# Patient Record
Sex: Female | Born: 1995 | Race: White | Hispanic: No | Marital: Married | Smoking: Never smoker
Health system: Southern US, Community
[De-identification: ages and names within clinical notes are randomized; demographics above are authoritative.]

## PROBLEM LIST (undated history)

## (undated) ENCOUNTER — Inpatient Hospital Stay: Payer: Self-pay

## (undated) DIAGNOSIS — K219 Gastro-esophageal reflux disease without esophagitis: Secondary | ICD-10-CM

## (undated) DIAGNOSIS — J020 Streptococcal pharyngitis: Secondary | ICD-10-CM

## (undated) DIAGNOSIS — D649 Anemia, unspecified: Secondary | ICD-10-CM

## (undated) DIAGNOSIS — R079 Chest pain, unspecified: Secondary | ICD-10-CM

## (undated) DIAGNOSIS — E039 Hypothyroidism, unspecified: Secondary | ICD-10-CM

## (undated) HISTORY — PX: ADENOIDECTOMY: SUR15

---
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
## 2016-01-28 ENCOUNTER — Encounter: Payer: Self-pay | Admitting: Emergency Medicine

## 2016-01-28 ENCOUNTER — Ambulatory Visit
Admission: EM | Admit: 2016-01-28 | Discharge: 2016-01-28 | Disposition: A | Discharge status: Home / Self Care | Payer: BLUE CROSS/BLUE SHIELD | Attending: Emergency Medicine | Admitting: Emergency Medicine

## 2016-01-28 DIAGNOSIS — J069 Acute upper respiratory infection, unspecified: Secondary | ICD-10-CM | POA: Diagnosis not present

## 2016-01-28 LAB — RAPID STREP SCREEN (MED CTR MEBANE ONLY): Streptococcus, Group A Screen (Direct): NEGATIVE

## 2016-01-28 MED ORDER — AMOXICILLIN-POT CLAVULANATE 875-125 MG PO TABS: 1.0000 | ORAL_TABLET | Freq: Two times a day (BID) | ORAL | 0 refills | Status: DC

## 2016-01-28 MED ORDER — FLUTICASONE PROPIONATE 50 MCG/ACT NA SUSP: 2.0000 | Freq: Every day | NASAL | 0 refills | Status: DC

## 2016-01-28 NOTE — ED Provider Notes (Signed)
CSN: [id number]     Arrival date & time 01/28/16  1932 History   First MD Initiated Contact with Patient 01/28/16 1954     Chief Complaint  Patient presents with  . Sore Throat   (Consider location/radiation/quality/duration/timing/severity/associated sxs/prior Treatment) HPI  This 20 year old female presents with a two-week history of sore throat cough and stuffy nose. She recently started complaining of right ear pain started 2 days ago. She's had no fever or chills. Her coughing has been essentially nonproductive. He has had mucus drainage from her nose of yellow-green color.   Past Medical History:  Diagnosis Date  . Strep pharyngitis    Past Surgical History:  Procedure Laterality Date  . adnoids     Family History  Problem Relation Age of Onset  . Breast cancer Mother   . Hyperlipidemia Father   . Alzheimer's disease Paternal Grandfather    Social History  Substance Use Topics  . Smoking status: Never Smoker  . Smokeless tobacco: Never Used  . Alcohol use No   OB History    No data available     Review of Systems  Constitutional: Negative for activity change, appetite change, chills, fatigue and fever.  HENT: Positive for congestion, ear pain, postnasal drip, rhinorrhea, sinus pressure and sore throat.   Respiratory: Positive for cough.   All other systems reviewed and are negative.   Allergies  Review of patient's allergies indicates no known allergies.  Home Medications   Prior to Admission medications   Medication Sig Start Date End Date Taking? Authorizing Provider  amoxicillin-clavulanate (AUGMENTIN) 875-125 MG tablet Take 1 tablet by mouth every 12 (twelve) hours. 01/28/16   Lutricia Feil, PA-C  ascorbic acid (VITAMIN C) 1000 MG tablet Take by mouth. 07/13/10   Historical Provider, MD  Biotin 10 MG CAPS Take by mouth.    Historical Provider, MD  Cetirizine HCl (ZYRTEC ALLERGY) 10 MG CAPS Take by mouth. Reported on 07/01/2015    Historical Provider, MD   Cholecalciferol (VITAMIN D3) 400 units CAPS Take by mouth.    Historical Provider, MD  diphenhydrAMINE (BENADRYL) 25 mg capsule Take 25 mg by mouth every 6 (six) hours as needed. Reported on 07/01/2015    Historical Provider, MD  fluticasone (FLONASE) 50 MCG/ACT nasal spray Place 2 sprays into both nostrils daily. 01/28/16   Lutricia Feil, PA-C  GLUCOSAMINE SULFATE PO Take by mouth.    Historical Provider, MD  ibuprofen (ADVIL,MOTRIN) 800 MG tablet Take 1 tablet (800 mg total) by mouth 3 (three) times daily. 08/02/15   Domenick Gong, MD  Lysine HCl 1000 MG TABS Take 2,000 mg by mouth.     Historical Provider, MD  medroxyPROGESTERone (PROVERA) 10 MG tablet Take 1 tablet (10 mg total) by mouth daily. 07/01/15   Lorie Phenix, MD  Multiple Vitamin (MULTIVITAMIN) capsule Take 1 capsule by mouth daily.    Historical Provider, MD  pyridOXINE (VITAMIN B-6) 100 MG tablet Take 100 mg by mouth daily.    Historical Provider, MD  Vitamin E 100 units TABS Take by mouth.    Historical Provider, MD   Meds Ordered and Administered this Visit  Medications - No data to display  BP 97/63 (BP Location: Left Arm)   Pulse 78   Temp 97.7 F (36.5 C) (Tympanic)   Resp 16   Ht 5\' 1"  (1.549 m)   Wt 110 lb (49.9 kg)   LMP 01/14/2016 (Exact Date)   SpO2 100%   BMI 20.78 kg/m  No  data found.   Physical Exam  Constitutional: She is oriented to person, place, and time. She appears well-developed and well-nourished. No distress.  HENT:  Head: Normocephalic and atraumatic.  Nose: Nose normal.  Mouth/Throat: Oropharynx is clear and moist.  TM is normal right TM is injected and dark. She has tenderness over the maxillary sinuses more on the right.  Eyes: EOM are normal. Pupils are equal, round, and reactive to light. Right eye exhibits no discharge. Left eye exhibits no discharge.  Neck: Normal range of motion. Neck supple.  Pulmonary/Chest: Effort normal and breath sounds normal. No respiratory distress. She  has no wheezes. She has no rales.  Musculoskeletal: Normal range of motion.  Lymphadenopathy:    She has no cervical adenopathy.  Neurological: She is alert and oriented to person, place, and time.  Skin: Skin is warm and dry. She is not diaphoretic.  Psychiatric: She has a normal mood and affect. Her behavior is normal. Judgment and thought content normal.  Nursing note and vitals reviewed.   Urgent Care Course   Clinical Course    Procedures (including critical care time)  Labs Review Labs Reviewed  RAPID STREP SCREEN (NOT AT [facility])  CULTURE, GROUP A STREP [facility]    Imaging Review No results found.   Visual Acuity Review  Right Eye Distance:   Left Eye Distance:   Bilateral Distance:    Right Eye Near:   Left Eye Near:    Bilateral Near:         MDM   1. URI (upper respiratory infection)    Discharge Medication List as of 01/28/2016  8:09 PM    START taking these medications   Details  amoxicillin-clavulanate (AUGMENTIN) 875-125 MG tablet Take 1 tablet by mouth every 12 (twelve) hours., Starting Fri 01/28/2016, Print    fluticasone (FLONASE) 50 MCG/ACT nasal spray Place 2 sprays into both nostrils daily., Starting Fri 01/28/2016, Print      Plan: 1. Test/x-ray results and diagnosis reviewed with patient 2. rx as per orders; risks, benefits, potential side effects reviewed with patient 3. Recommend supportive treatment with Flonase. I will start her on Augmentin since she has had this for 2 weeks and has a greenish yellow discharge from her nose along with the tenderness to percussion on the left maxillary sinus. If she is not improving or recommend she follow-up with her primary care physician. 4. F/u prn if symptoms worsen or don't improve     Lutricia FeilWilliam P Takasha Vetere, PA-C 01/28/16 2039

## 2016-01-28 NOTE — ED Triage Notes (Signed)
Patient c/o sore throat, cough, stuffy nose for 2 weeks.  Patient c/o right ear pain that started 2 days ago. Patient denies fevers.

## 2016-01-31 LAB — CULTURE, GROUP A STREP (THRC)

## 2016-03-12 ENCOUNTER — Ambulatory Visit
Admission: EM | Admit: 2016-03-12 | Discharge: 2016-03-12 | Disposition: A | Discharge status: Home / Self Care | Payer: BLUE CROSS/BLUE SHIELD | Attending: Family Medicine | Admitting: Family Medicine

## 2016-03-12 DIAGNOSIS — H6981 Other specified disorders of Eustachian tube, right ear: Secondary | ICD-10-CM | POA: Diagnosis not present

## 2016-03-12 DIAGNOSIS — H9201 Otalgia, right ear: Secondary | ICD-10-CM

## 2016-03-12 MED ORDER — FLUTICASONE PROPIONATE 50 MCG/ACT NA SUSP
2.0000 | Freq: Every day | NASAL | 2 refills | Status: DC
Start: 2016-03-12 — End: 2016-05-18

## 2016-03-12 MED ORDER — AMOXICILLIN-POT CLAVULANATE 875-125 MG PO TABS: 1.0000 | ORAL_TABLET | Freq: Two times a day (BID) | ORAL | 0 refills | Status: DC

## 2016-03-12 MED ORDER — FEXOFENADINE-PSEUDOEPHED ER 180-240 MG PO TB24: 1.0000 | ORAL_TABLET | Freq: Every day | ORAL | 0 refills | Status: DC

## 2016-03-12 MED ORDER — PREDNISONE 10 MG (21) PO TBPK: ORAL_TABLET | ORAL | 0 refills | Status: DC

## 2016-03-12 NOTE — ED Triage Notes (Signed)
Patient complains of right ear pain with clogging. Patient states that she was seen for this last month and she has tried OTC ear drops without relief.

## 2016-03-12 NOTE — ED Provider Notes (Signed)
[facility]    CSN: [id number] Arrival date & time: 03/12/16  1536     History   Chief Complaint Chief Complaint  Patient presents with  . Otalgia    HPI Kelly Ford is a 20 y.o. female.   Patient is here because of right ear pain. She was seen by PA Mr. Phillis Knack of a few weeks ago. She states she was placed on Flonase but has not helped the right ear pain. She states that the right ear did not get better all and she still has pressure and buildup of discomfort over the right eardrum. She does not smoke. Does history of breast cancer mother and father with hyperlipidemia. In reviewing the notes Mr. Phillis Knack indicated he was calling in or saying a prescription for Augmentin and Flonase.   The history is provided by the patient. No language interpreter was used.  Otalgia  Location:  Right Quality:  Aching and pressure Severity:  Moderate Onset quality:  Unable to specify Timing:  Constant Progression:  Worsening Chronicity:  New Context: recent URI   Context: not direct blow, not elevation change, not foreign body in ear and not loud noise   Relieved by:  Nothing Worsened by:  Nothing Associated symptoms: congestion     Past Medical History:  Diagnosis Date  . Strep pharyngitis     Patient Active Problem List   Diagnosis Date Noted  . Anovulatory cycle 07/01/2015  . Constipation 07/01/2015  . Cystitis 06/22/2015  . Allergic rhinitis 06/21/2015  . Acid reflux 06/21/2015    Past Surgical History:  Procedure Laterality Date  . adnoids      OB History    No data available       Home Medications    Prior to Admission medications   Medication Sig Start Date End Date Taking? Authorizing Provider  ascorbic acid (VITAMIN C) 1000 MG tablet Take by mouth. 07/13/10  Yes Historical Provider, MD  Biotin 10 MG CAPS Take by mouth.   Yes Historical Provider, MD  Cholecalciferol (VITAMIN D3) 400 units CAPS Take by mouth.   Yes Historical Provider,  MD  diphenhydrAMINE (BENADRYL) 25 mg capsule Take 25 mg by mouth every 6 (six) hours as needed. Reported on 07/01/2015   Yes Historical Provider, MD  fluticasone (FLONASE) 50 MCG/ACT nasal spray Place 2 sprays into both nostrils daily. 01/28/16  Yes William P Roemer, PA-C  GLUCOSAMINE SULFATE PO Take by mouth.   Yes Historical Provider, MD  ibuprofen (ADVIL,MOTRIN) 800 MG tablet Take 1 tablet (800 mg total) by mouth 3 (three) times daily. 08/02/15  Yes Domenick Gong, MD  Lysine HCl 1000 MG TABS Take 2,000 mg by mouth.    Yes Historical Provider, MD  Multiple Vitamin (MULTIVITAMIN) capsule Take 1 capsule by mouth daily.   Yes Historical Provider, MD  pyridOXINE (VITAMIN B-6) 100 MG tablet Take 100 mg by mouth daily.   Yes Historical Provider, MD  Vitamin E 100 units TABS Take by mouth.   Yes Historical Provider, MD  amoxicillin-clavulanate (AUGMENTIN) 875-125 MG tablet Take 1 tablet by mouth every 12 (twelve) hours. 01/28/16   Lutricia Feil, PA-C  amoxicillin-clavulanate (AUGMENTIN) 875-125 MG tablet Take 1 tablet by mouth 2 (two) times daily. 03/12/16   Hassan Rowan, MD  Cetirizine HCl (ZYRTEC ALLERGY) 10 MG CAPS Take by mouth. Reported on 07/01/2015    Historical Provider, MD  fexofenadine-pseudoephedrine (ALLEGRA-D ALLERGY & CONGESTION) 180-240 MG 24 hr tablet Take 1 tablet by mouth daily. 03/12/16  Hassan RowanEugene Syana Degraffenreid, MD  fluticasone [facility](FLONASE) 50 MCG/ACT nasal spray Place 2 sprays into both nostrils daily. 03/12/16   Hassan RowanEugene Jakori Burkett, MD  predniSONE (STERAPRED UNI-PAK 21 TAB) 10 MG (21) TBPK tablet 6 tabs day 1 and 2, 5 tabs day 3 and 4, 4 tabs day 5 and 6, 3 tabs day 7 and 8, 2 tabs day 9 and 10, 1 tab day 11 and 12. Take orally 03/12/16   Hassan RowanEugene Rufus Beske, MD    Family History Family History  Problem Relation Age of Onset  . Breast cancer Mother   . Hyperlipidemia Father   . Alzheimer's disease Paternal Grandfather     Social History Social History  Substance Use Topics  . Smoking status: Never  Smoker  . Smokeless tobacco: Never Used  . Alcohol use No     Allergies   Review of patient's allergies indicates no known allergies.   Review of Systems Review of Systems  HENT: Positive for congestion and ear pain.      Physical Exam Triage Vital Signs ED Triage Vitals  Enc Vitals Group     BP 03/12/16 1550 93/63     Pulse Rate 03/12/16 1550 86     Resp 03/12/16 1550 16     Temp 03/12/16 1550 98.5 F (36.9 C)     Temp src --      SpO2 03/12/16 1550 98 %     Weight 03/12/16 1548 110 lb (49.9 kg)     Height 03/12/16 1548 5\' 1"  (1.549 m)     Head Circumference --      Peak Flow --      Pain Score 03/12/16 1552 5     Pain Loc --      Pain Edu? --      Excl. in GC? --    No data found.   Updated Vital Signs BP 93/63 (BP Location: Left Arm)   Pulse 86   Temp 98.5 F (36.9 C)   Resp 16   Ht 5\' 1"  (1.549 m)   Wt 110 lb (49.9 kg)   LMP 02/17/2016   SpO2 98%   BMI 20.78 kg/m   Visual Acuity Right Eye Distance:   Left Eye Distance:   Bilateral Distance:    Right Eye Near:   Left Eye Near:    Bilateral Near:     Physical Exam  HENT:  Head: Normocephalic.  Right Ear: Hearing, external ear and ear canal normal. Tympanic membrane is bulging.  Left Ear: Hearing, tympanic membrane, external ear and ear canal normal.  Nose: No mucosal edema, rhinorrhea or sinus tenderness. Right sinus exhibits no maxillary sinus tenderness and no frontal sinus tenderness. Left sinus exhibits no maxillary sinus tenderness and no frontal sinus tenderness.  Eyes: Pupils are equal, round, and reactive to light.  Neck: Normal range of motion. Neck supple.  Pulmonary/Chest: Effort normal.  Musculoskeletal: Normal range of motion.  Lymphadenopathy:    She has no cervical adenopathy.  Neurological: She is alert.  Skin: Skin is warm.  Psychiatric: She has a normal mood and affect.  Vitals reviewed.    UC Treatments / Results  Labs (all labs ordered are listed, but only  abnormal results are displayed) Labs Reviewed - No data to display  EKG  EKG Interpretation None       Radiology No results found.  Procedures Procedures (including critical care time)  Medications Ordered in UC Medications - No data to display   Initial Impression / Assessment and  Plan / UC Course  I have reviewed the triage vital signs and the nursing notes.  Pertinent labs & imaging results that were available during my care of the patient were reviewed by me and considered in my medical decision making (see chart for details).  Clinical Course    Patient insists that she never got a prescription for Augmentin. I will go ahead and place on a prescription for Augmentin repeat the Flonase dates spray [email or web address].  Final Clinical Impressions(s) / UC Diagnoses   Final diagnoses:  Otalgia of right ear  Eustachian tube dysfunction, right    New Prescriptions New Prescriptions   AMOXICILLIN-CLAVULANATE (AUGMENTIN) 875-125 MG TABLET    Take 1 tablet by mouth 2 (two) times daily.   FEXOFENADINE-PSEUDOEPHEDRINE (ALLEGRA-D ALLERGY & CONGESTION) 180-240 MG 24 HR TABLET    Take 1 tablet by mouth daily.   FLUTICASONE (FLONASE) 50 MCG/ACT NASAL SPRAY    Place 2 sprays into both nostrils daily.   PREDNISONE (STERAPRED UNI-PAK 21 TAB) 10 MG (21) TBPK TABLET    6 tabs day 1 and 2, 5 tabs day 3 and 4, 4 tabs day 5 and 6, 3 tabs day 7 and 8, 2 tabs day 9 and 10, 1 tab day 11 and 12. Take orally     Hassan RowanEugene Aquila Menzie, MD 03/12/16 [phone]

## 2016-03-15 ENCOUNTER — Telehealth: Payer: Self-pay

## 2016-03-15 NOTE — Telephone Encounter (Signed)
Courtesy call back completed today for patient's recent visit at [facility]. Patient did not answer, left message on machine to call back with any questions or concerns.   

## 2016-05-08 ENCOUNTER — Emergency Department
Admission: EM | Admit: 2016-05-08 | Discharge: 2016-05-08 | Disposition: A | Discharge status: Home / Self Care | Payer: BLUE CROSS/BLUE SHIELD | Attending: Emergency Medicine | Admitting: Emergency Medicine

## 2016-05-08 ENCOUNTER — Encounter: Payer: Self-pay | Admitting: *Deleted

## 2016-05-08 ENCOUNTER — Emergency Department: Payer: BLUE CROSS/BLUE SHIELD

## 2016-05-08 DIAGNOSIS — R0602 Shortness of breath: Secondary | ICD-10-CM | POA: Diagnosis present

## 2016-05-08 DIAGNOSIS — R0789 Other chest pain: Secondary | ICD-10-CM | POA: Insufficient documentation

## 2016-05-08 LAB — CBC WITH DIFFERENTIAL/PLATELET
Basophils Absolute: 0 10*3/uL (ref 0–0.1)
Basophils Relative: 1 %
Eosinophils Absolute: 0.1 10*3/uL (ref 0–0.7)
Eosinophils Relative: 1 %
HCT: 41.1 % (ref 35.0–47.0)
Hemoglobin: 13.8 g/dL (ref 12.0–16.0)
Lymphocytes Relative: 33 %
Lymphs Abs: 2.1 10*3/uL (ref 1.0–3.6)
MCH: 28.9 pg (ref 26.0–34.0)
MCHC: 33.7 g/dL (ref 32.0–36.0)
MCV: 85.8 fL (ref 80.0–100.0)
Monocytes Absolute: 0.4 10*3/uL (ref 0.2–0.9)
Monocytes Relative: 7 %
Neutro Abs: 3.6 10*3/uL (ref 1.4–6.5)
Neutrophils Relative %: 58 %
Platelets: 219 10*3/uL (ref 150–440)
RBC: 4.79 MIL/uL (ref 3.80–5.20)
RDW: 13.6 % (ref 11.5–14.5)
WBC: 6.2 10*3/uL (ref 3.6–11.0)

## 2016-05-08 LAB — BASIC METABOLIC PANEL
Anion gap: 6 (ref 5–15)
BUN: 14 mg/dL (ref 6–20)
CO2: 27 mmol/L (ref 22–32)
Calcium: 9.1 mg/dL (ref 8.9–10.3)
Chloride: 105 mmol/L (ref 101–111)
Creatinine, Ser: 0.78 mg/dL (ref 0.44–1.00)
GFR calc Af Amer: >60 mL/min (ref >60)
GFR calc non Af Amer: >60 mL/min (ref >60)
Glucose, Bld: 106 mg/dL — ABNORMAL HIGH (ref 65–99)
Potassium: 3.4 mmol/L — ABNORMAL LOW (ref 3.5–5.1)
Sodium: 138 mmol/L (ref 135–145)

## 2016-05-08 LAB — POCT PREGNANCY, URINE: Preg Test, Ur: NEGATIVE

## 2016-05-08 LAB — TROPONIN I: Troponin I: <0.03 ng/mL (ref <0.03)

## 2016-05-08 MED ORDER — GI COCKTAIL ~~LOC~~
30.0000 mL | Freq: Once | ORAL | Status: AC
  Administered 2016-05-08: 30 mL via ORAL
  Filled 2016-05-08: qty 30

## 2016-05-08 NOTE — ED Provider Notes (Signed)
[facility] Emergency Department Provider Note   ____________________________________________   I have reviewed the triage vital signs and the nursing notes.   HISTORY  Chief Complaint Chest Pain and Shortness of Breath   History limited by: Not Limited   HPI Kelly Ford is a 20 y.o. female who presents to the emergency department today because of concerns for chest pain. She describes it as sharp. It is located in the center chest. Started just shortly before presentation while the patient was eating dinner. The patient states that this was coming by some shortness of breath. She also felt like her right arm was shaking. Patient has never had symptoms this bad before but does state that she has been getting some chest pain roughly once a month. Patient denies being under any recent stress. No recent travel. She is not on any estrogen. No history of blood counts. No surgeries. Family history with grandfather with dysrhythmias.   Past Medical History:  Diagnosis Date  . Strep pharyngitis     Patient Active Problem List   Diagnosis Date Noted  . Anovulatory cycle 07/01/2015  . Constipation 07/01/2015  . Cystitis 06/22/2015  . Allergic rhinitis 06/21/2015  . Acid reflux 06/21/2015    Past Surgical History:  Procedure Laterality Date  . adnoids      Prior to Admission medications   Medication Sig Start Date End Date Taking? Authorizing Provider  amoxicillin-clavulanate (AUGMENTIN) 875-125 MG tablet Take 1 tablet by mouth every 12 (twelve) hours. 01/28/16   Lutricia FeilWilliam P Roemer, PA-C  amoxicillin-clavulanate (AUGMENTIN) 875-125 MG tablet Take 1 tablet by mouth 2 (two) times daily. 03/12/16   Hassan RowanEugene Wade, MD  ascorbic acid (VITAMIN C) 1000 MG tablet Take by mouth. 07/13/10   Historical Provider, MD  Biotin 10 MG CAPS Take by mouth.    Historical Provider, MD  Cetirizine HCl (ZYRTEC ALLERGY) 10 MG CAPS Take by mouth. Reported on 07/01/2015     Historical Provider, MD  Cholecalciferol (VITAMIN D3) 400 units CAPS Take by mouth.    Historical Provider, MD  diphenhydrAMINE (BENADRYL) 25 mg capsule Take 25 mg by mouth every 6 (six) hours as needed. Reported on 07/01/2015    Historical Provider, MD  fexofenadine-pseudoephedrine (ALLEGRA-D ALLERGY & CONGESTION) 180-240 MG 24 hr tablet Take 1 tablet by mouth daily. 03/12/16   Hassan RowanEugene Wade, MD  fluticasone (FLONASE) 50 MCG/ACT nasal spray Place 2 sprays into both nostrils daily. 01/28/16   Lutricia FeilWilliam P Roemer, PA-C  fluticasone (FLONASE) 50 MCG/ACT nasal spray Place 2 sprays into both nostrils daily. 03/12/16   Hassan RowanEugene Wade, MD  GLUCOSAMINE SULFATE PO Take by mouth.    Historical Provider, MD  ibuprofen (ADVIL,MOTRIN) 800 MG tablet Take 1 tablet (800 mg total) by mouth 3 (three) times daily. 08/02/15   Domenick GongAshley Mortenson, MD  Lysine HCl 1000 MG TABS Take 2,000 mg by mouth.     Historical Provider, MD  Multiple Vitamin (MULTIVITAMIN) capsule Take 1 capsule by mouth daily.    Historical Provider, MD  predniSONE (STERAPRED UNI-PAK 21 TAB) 10 MG (21) TBPK tablet 6 tabs day 1 and 2, 5 tabs day 3 and 4, 4 tabs day 5 and 6, 3 tabs day 7 and 8, 2 tabs day 9 and 10, 1 tab day 11 and 12. Take orally 03/12/16   Hassan RowanEugene Wade, MD  pyridOXINE (VITAMIN B-6) 100 MG tablet Take 100 mg by mouth daily.    Historical Provider, MD  Vitamin E 100 units TABS Take by mouth.  Historical Provider, MD    Allergies Patient has no known allergies.  Family History  Problem Relation Age of Onset  . Breast cancer Mother   . Hyperlipidemia Father   . Alzheimer's disease Paternal Grandfather     Social History Social History  Substance Use Topics  . Smoking status: Never Smoker  . Smokeless tobacco: Never Used  . Alcohol use No    Review of Systems  Constitutional: Negative for fever. Cardiovascular: Positive for chest pain. Respiratory: Positive for shortness of breath. Gastrointestinal: Negative for abdominal pain,  vomiting and diarrhea. Genitourinary: Negative for dysuria. Musculoskeletal: Negative for back pain. Skin: Negative for rash. Neurological: Negative for headaches, focal weakness or numbness.  10-point ROS otherwise negative.  ____________________________________________   PHYSICAL EXAM:  VITAL SIGNS: ED Triage Vitals  Enc Vitals Group     BP 05/08/16 1717 120/83     Pulse Rate 05/08/16 1717 76     Resp 05/08/16 1717 (!) 22     Temp 05/08/16 1717 98.6 F (37 C)     Temp Source 05/08/16 1717 Oral     SpO2 05/08/16 1717 99 %     Weight 05/08/16 1714 107 lb (48.5 kg)     Height 05/08/16 1714 5\' 1"  (1.549 m)     Head Circumference --      Peak Flow --      Pain Score 05/08/16 1716 7     Pain Loc --      Pain Edu? --      Excl. in GC? --     Constitutional: Alert and oriented. Well appearing and in no distress. Eyes: Conjunctivae are normal. Normal extraocular movements. ENT   Head: Normocephalic and atraumatic.   Nose: No congestion/rhinnorhea.   Mouth/Throat: Mucous membranes are moist.   Neck: No stridor. Hematological/Lymphatic/Immunilogical: No cervical lymphadenopathy. Cardiovascular: Normal rate, regular rhythm.  No murmurs, rubs, or gallops.  Respiratory: Normal respiratory effort without tachypnea nor retractions. Breath sounds are clear and equal bilaterally. No wheezes/rales/rhonchi. Gastrointestinal: Soft and non tender. No rebound. No guarding.  Genitourinary: Deferred Musculoskeletal: Normal range of motion in all extremities. No lower extremity edema. Neurologic:  Normal speech and language. No gross focal neurologic deficits are appreciated.  Skin:  Skin is warm, dry and intact. No rash noted. Psychiatric: Mood and affect are normal. Speech and behavior are normal. Patient exhibits appropriate insight and judgment.  ____________________________________________    LABS (pertinent positives/negatives)  Labs Reviewed  BASIC METABOLIC PANEL -  Abnormal; Notable for the following:       Result Value   Potassium 3.4 (*)    Glucose, Bld 106 (*)    All other components within normal limits  CBC WITH DIFFERENTIAL/PLATELET  TROPONIN I  POCT PREGNANCY, URINE     ____________________________________________   EKG  I, Phineas SemenGraydon Jeydi Klingel, attending physician, personally viewed and interpreted this EKG  EKG Time: 1719 Rate: 73 Rhythm: normal sinus rhythm Axis: normal Intervals: qtc 442 QRS: non specific intrventricular conduction delay ST changes: no st elevation Impression: abnormal ekg   ____________________________________________    RADIOLOGY  CXR IMPRESSION:  Normal chest radiograph.      ___________________________________________   PROCEDURES  Procedures  ____________________________________________   INITIAL IMPRESSION / ASSESSMENT AND PLAN / ED COURSE  Pertinent labs & imaging results that were available during my care of the patient were reviewed by me and considered in my medical decision making (see chart for details).  Patient presented to the emergency department today because of concerns  for chest pain and shortness breath that occurred this evening. Exam patient no acute distress. GU with no ST elevation. Blood work was obtained which did not show any concerning findings. I did send a troponin however this was negative. At this point I doubt ACS. Patient has an atypical story. No risk factors. Additionally patient perked negative. No pneumothorax pneumonia identified on chest x-ray. Patient stated she felt better during the course of her stay here in the emergency department. Will discharge home to follow up with primary care physician.  ____________________________________________   FINAL CLINICAL IMPRESSION(S) / ED DIAGNOSES  Final diagnoses:  Atypical chest pain     Note: This dictation was prepared with Dragon dictation. Any transcriptional errors that result from this process are  unintentional     Phineas Semen, MD 05/08/16 1945

## 2016-05-08 NOTE — Discharge Instructions (Signed)
Please seek medical attention for any high fevers, chest pain, shortness of breath, change in behavior, persistent vomiting, bloody stool or any other new or concerning symptoms.  

## 2016-05-08 NOTE — ED Triage Notes (Signed)
States she was eating dinner and suddenly had a sharp pain in the middle of her chest, states SOB began, states feeling lightheaded in the car, pt shaking during triage

## 2016-05-18 ENCOUNTER — Ambulatory Visit (INDEPENDENT_AMBULATORY_CARE_PROVIDER_SITE_OTHER): Payer: BLUE CROSS/BLUE SHIELD

## 2016-05-18 ENCOUNTER — Ambulatory Visit
Admission: EM | Admit: 2016-05-18 | Discharge: 2016-05-18 | Disposition: A | Discharge status: Home / Self Care | Payer: BLUE CROSS/BLUE SHIELD | Attending: Family Medicine | Admitting: Family Medicine

## 2016-05-18 DIAGNOSIS — R112 Nausea with vomiting, unspecified: Secondary | ICD-10-CM | POA: Diagnosis not present

## 2016-05-18 DIAGNOSIS — K5909 Other constipation: Secondary | ICD-10-CM | POA: Diagnosis not present

## 2016-05-18 DIAGNOSIS — N3001 Acute cystitis with hematuria: Secondary | ICD-10-CM

## 2016-05-18 DIAGNOSIS — R1012 Left upper quadrant pain: Secondary | ICD-10-CM | POA: Diagnosis not present

## 2016-05-18 DIAGNOSIS — Z79899 Other long term (current) drug therapy: Secondary | ICD-10-CM | POA: Insufficient documentation

## 2016-05-18 DIAGNOSIS — R1010 Upper abdominal pain, unspecified: Secondary | ICD-10-CM | POA: Diagnosis not present

## 2016-05-18 DIAGNOSIS — K5900 Constipation, unspecified: Secondary | ICD-10-CM | POA: Diagnosis not present

## 2016-05-18 DIAGNOSIS — R079 Chest pain, unspecified: Secondary | ICD-10-CM | POA: Diagnosis not present

## 2016-05-18 DIAGNOSIS — R1110 Vomiting, unspecified: Secondary | ICD-10-CM | POA: Diagnosis present

## 2016-05-18 LAB — URINALYSIS, COMPLETE (UACMP) WITH MICROSCOPIC
Bilirubin Urine: NEGATIVE
Glucose, UA: NEGATIVE mg/dL
Ketones, ur: NEGATIVE mg/dL
Nitrite: NEGATIVE
Protein, ur: NEGATIVE mg/dL
Specific Gravity, Urine: 1.015 (ref 1.005–1.030)
pH: 7 (ref 5.0–8.0)

## 2016-05-18 LAB — RAPID STREP SCREEN (MED CTR MEBANE ONLY): Streptococcus, Group A Screen (Direct): NEGATIVE

## 2016-05-18 LAB — CBC WITH DIFFERENTIAL/PLATELET
Basophils Absolute: 0 10*3/uL (ref 0–0.1)
Basophils Relative: 1 %
Eosinophils Absolute: 0.1 10*3/uL (ref 0–0.7)
Eosinophils Relative: 1 %
HCT: 44.1 % (ref 35.0–47.0)
Hemoglobin: 14.6 g/dL (ref 12.0–16.0)
Lymphocytes Relative: 33 %
Lymphs Abs: 2.3 10*3/uL (ref 1.0–3.6)
MCH: 28.4 pg (ref 26.0–34.0)
MCHC: 33.1 g/dL (ref 32.0–36.0)
MCV: 85.9 fL (ref 80.0–100.0)
Monocytes Absolute: 0.5 10*3/uL (ref 0.2–0.9)
Monocytes Relative: 8 %
Neutro Abs: 4 10*3/uL (ref 1.4–6.5)
Neutrophils Relative %: 57 %
Platelets: 228 10*3/uL (ref 150–440)
RBC: 5.14 MIL/uL (ref 3.80–5.20)
RDW: 13.3 % (ref 11.5–14.5)
WBC: 7 10*3/uL (ref 3.6–11.0)

## 2016-05-18 LAB — COMPREHENSIVE METABOLIC PANEL
ALT: 33 U/L (ref 14–54)
AST: 35 U/L (ref 15–41)
Albumin: 4.8 g/dL (ref 3.5–5.0)
Alkaline Phosphatase: 50 U/L (ref 38–126)
Anion gap: 9 (ref 5–15)
BUN: 7 mg/dL (ref 6–20)
CO2: 23 mmol/L (ref 22–32)
Calcium: 9.4 mg/dL (ref 8.9–10.3)
Chloride: 102 mmol/L (ref 101–111)
Creatinine, Ser: 0.78 mg/dL (ref 0.44–1.00)
GFR calc Af Amer: >60 mL/min (ref >60)
GFR calc non Af Amer: >60 mL/min (ref >60)
Glucose, Bld: 91 mg/dL (ref 65–99)
Potassium: 3.7 mmol/L (ref 3.5–5.1)
Sodium: 134 mmol/L — ABNORMAL LOW (ref 135–145)
Total Bilirubin: 1.1 mg/dL (ref 0.3–1.2)
Total Protein: 8.3 g/dL — ABNORMAL HIGH (ref 6.5–8.1)

## 2016-05-18 LAB — PREGNANCY, URINE: Preg Test, Ur: NEGATIVE

## 2016-05-18 LAB — LIPASE, BLOOD: Lipase: 39 U/L (ref 11–51)

## 2016-05-18 LAB — RAPID INFLUENZA A&B ANTIGENS
Influenza A (ARMC): NEGATIVE
Influenza B (ARMC): NEGATIVE

## 2016-05-18 LAB — MONONUCLEOSIS SCREEN: Mono Screen: NEGATIVE

## 2016-05-18 LAB — AMYLASE: Amylase: 108 U/L — ABNORMAL HIGH (ref 28–100)

## 2016-05-18 MED ORDER — ONDANSETRON 8 MG PO TBDP
8.0000 mg | ORAL_TABLET | Freq: Once | ORAL | Status: AC
  Administered 2016-05-18: 8 mg via ORAL

## 2016-05-18 MED ORDER — PHENAZOPYRIDINE HCL 200 MG PO TABS: 200.0000 mg | ORAL_TABLET | Freq: Three times a day (TID) | ORAL | 0 refills | Status: DC | PRN

## 2016-05-18 MED ORDER — MAGNESIUM CITRATE PO SOLN: 1.0000 | Freq: Once | ORAL | 0 refills | Status: AC

## 2016-05-18 MED ORDER — ONDANSETRON 8 MG PO TBDP
8.0000 mg | ORAL_TABLET | Freq: Three times a day (TID) | ORAL | 0 refills | Status: DC | PRN
Start: 2016-05-18 — End: 2016-05-27

## 2016-05-18 MED ORDER — CIPROFLOXACIN HCL 500 MG PO TABS: 500.0000 mg | ORAL_TABLET | Freq: Two times a day (BID) | ORAL | 0 refills | Status: DC

## 2016-05-18 NOTE — ED Triage Notes (Signed)
Pt c/o flulike symptoms, vomiting, sore throat, cough, low grade temps and back aches and pain.

## 2016-05-18 NOTE — ED Provider Notes (Signed)
[facility]    CSN: [id number] Arrival date & time: 05/18/16  1748     History   Chief Complaint Chief Complaint  Patient presents with  . Emesis    Flu like Symptoms    HPI Kelly Ford is a 21 y.o. female.   Patient is a [age] [address] white female with multiple complaints of multiple problems. She states the last 2 days she's had nausea and vomiting and has thrown up at least 7 times the last 2 days. She reports left upper abdominal pain and left back pain. She denies a chance of being pregnant. She denies dysuria or frequency or burning with urination. Last list. Was December 5. She also reports having chest pain in the chest pain started Tuesday as well chest pain has cleared up some she's also had a sore throat and achy all over as well as fatigue. She had fever Tuesday night but the fever has since passed. She does not smoke. No pertinent past family medical history no known drug allergies. 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 She'll come in to be seen and evaluated. No known drug allergies. She states that she had chest pain a few months ago was seen in the emergency room they diagnosed her having anxiety attack she states that everything cleared up then she started having chest pain again Tuesday. As with the chest pain she is also had abdominal pain that she states she still nauseous   The history is provided by the patient. No language interpreter was used.  Emesis  Severity:  Moderate Duration:  2 days Timing:  Constant Quality:  Stomach contents Progression:  Worsening Chronicity:  New Recent urination:  Normal Relieved by:  Nothing Worsened by:  Nothing Associated symptoms: abdominal pain, fever, myalgias and sore throat     Past Medical History:  Diagnosis Date  . Strep pharyngitis     Patient Active Problem List   Diagnosis Date Noted  . Anovulatory cycle 07/01/2015  .  Constipation 07/01/2015  . Cystitis 06/22/2015  . Allergic rhinitis 06/21/2015  . Acid reflux 06/21/2015    Past Surgical History:  Procedure Laterality Date  . adnoids      OB History    No data available       Home Medications    Prior to Admission medications   Medication Sig Start Date End Date Taking? Authorizing Provider  ascorbic acid (VITAMIN C) 1000 MG tablet Take by mouth. 07/13/10  Yes Historical Provider, MD  Biotin 10 MG CAPS Take by mouth.   Yes Historical Provider, MD  Cetirizine HCl (ZYRTEC ALLERGY) 10 MG CAPS Take by mouth. Reported on 07/01/2015   Yes Historical Provider, MD  Cholecalciferol (VITAMIN D3) 400 units CAPS Take by mouth.   Yes Historical Provider, MD  diphenhydrAMINE (BENADRYL) 25 mg capsule Take 25 mg by mouth every 6 (six) hours as needed. Reported on 07/01/2015   Yes Historical Provider, MD  fexofenadine-pseudoephedrine (ALLEGRA-D ALLERGY & CONGESTION) 180-240 MG 24 hr tablet Take 1 tablet by mouth daily. 03/12/16  Yes Hassan RowanEugene Aryahi Denzler, MD  ibuprofen (ADVIL,MOTRIN) 800 MG tablet Take 1 tablet (800 mg total) by mouth 3 (three) times daily. 08/02/15  Yes Domenick GongAshley Mortenson, MD  Lysine HCl 1000 MG TABS Take 2,000 mg by mouth.    Yes Historical Provider, MD  Multiple Vitamin (MULTIVITAMIN) capsule Take 1 capsule by mouth daily.   Yes Historical Provider,  MD  pyridOXINE (VITAMIN B-6) 100 MG tablet Take 100 mg by mouth daily.   Yes Historical Provider, MD  Vitamin E 100 units TABS Take by mouth.   Yes Historical Provider, MD  ciprofloxacin (CIPRO) 500 MG tablet Take 1 tablet (500 mg total) by mouth 2 (two) times daily. 05/18/16   Hassan Rowan, MD  GLUCOSAMINE SULFATE PO Take by mouth.    Historical Provider, MD  magnesium citrate SOLN Take 296 mLs (1 Bottle total) by mouth once. 05/18/16 05/18/16  Hassan Rowan, MD  ondansetron (ZOFRAN ODT) 8 MG disintegrating tablet Take 1 tablet (8 mg total) by mouth every 8 (eight) hours as needed for nausea or vomiting. 05/18/16   Hassan Rowan, MD  phenazopyridine (PYRIDIUM) 200 MG tablet Take 1 tablet (200 mg total) by mouth 3 (three) times daily as needed for pain. 05/18/16   Hassan Rowan, MD    Family History Family History  Problem Relation Age of Onset  . Breast cancer Mother   . Hyperlipidemia Father   . Alzheimer's disease Paternal Grandfather     Social History Social History  Substance Use Topics  . Smoking status: Never Smoker  . Smokeless tobacco: Never Used  . Alcohol use No     Allergies   Patient has no known allergies.   Review of Systems Review of Systems  Constitutional: Positive for activity change and fever.  HENT: Positive for sore throat.   Cardiovascular: Positive for chest pain.  Gastrointestinal: Positive for abdominal pain, nausea and vomiting.  Genitourinary: Negative for difficulty urinating, vaginal bleeding, vaginal discharge and vaginal pain.  Musculoskeletal: Positive for myalgias.  All other systems reviewed and are negative.    Physical Exam Triage Vital Signs ED Triage Vitals  Enc Vitals Group     BP 05/18/16 1901 97/62     Pulse Rate 05/18/16 1901 98     Resp 05/18/16 1901 18     Temp 05/18/16 1901 98.8 F (37.1 C)     Temp Source 05/18/16 1901 Oral     SpO2 05/18/16 1901 100 %     Weight 05/18/16 1902 107 lb (48.5 kg)     Height 05/18/16 1902 5\' 1"  (1.549 m)     Head Circumference --      Peak Flow --      Pain Score 05/18/16 1903 8     Pain Loc --      Pain Edu? --      Excl. in GC? --    No data found.   Updated Vital Signs BP 97/62 (BP Location: Left Arm)   Pulse 98   Temp 98.8 F (37.1 C) (Oral)   Resp 18   Ht 5\' 1"  (1.549 m)   Wt 107 lb (48.5 kg)   LMP 04/18/2016 Comment: neg preg   SpO2 100%   BMI 20.22 kg/m   Visual Acuity Right Eye Distance:   Left Eye Distance:   Bilateral Distance:    Right Eye Near:   Left Eye Near:    Bilateral Near:     Physical Exam  Constitutional: She is oriented to person, place, and time. She appears  well-developed and well-nourished. She is active.  Non-toxic appearance. She does not have a sickly appearance. She does not appear ill. No distress.  HENT:  Head: Normocephalic.  Right Ear: Hearing, tympanic membrane, external ear and ear canal normal.  Left Ear: Hearing, tympanic membrane and ear canal normal.  Nose: No mucosal edema, rhinorrhea or sinus tenderness.  Right sinus exhibits no maxillary sinus tenderness and no frontal sinus tenderness. Left sinus exhibits no maxillary sinus tenderness and no frontal sinus tenderness.  Mouth/Throat: Uvula is midline. No uvula swelling. Posterior oropharyngeal erythema present.  Eyes: Conjunctivae are normal. Pupils are equal, round, and reactive to light.  Neck: Normal range of motion. Neck supple.  Cardiovascular: Normal rate, regular rhythm and normal heart sounds.   Pulmonary/Chest: Breath sounds normal. No respiratory distress. She has no wheezes.  Abdominal: Soft. Bowel sounds are normal. She exhibits no distension. There is splenomegaly. There is no hepatomegaly. There is tenderness in the left upper quadrant. There is CVA tenderness. No hernia. Hernia confirmed negative in the right inguinal area and confirmed negative in the left inguinal area.    Patient has has tenderness over the left upper quadrant and questionable splenomegaly is present as well some mild CVA tenderness on the left side also  Musculoskeletal: She exhibits no edema, tenderness or deformity.  Lymphadenopathy:    She has no cervical adenopathy.  Neurological: She is alert and oriented to person, place, and time.  Skin: Skin is warm and dry. She is not diaphoretic.  Psychiatric: She has a normal mood and affect.  Vitals reviewed.    UC Treatments / Results  Labs (all labs ordered are listed, but only abnormal results are displayed) Labs Reviewed  AMYLASE - Abnormal; Notable for the following:       Result Value   Amylase 108 (*)    All other components within  normal limits  URINALYSIS, COMPLETE (UACMP) WITH MICROSCOPIC - Abnormal; Notable for the following:    APPearance HAZY (*)    Hgb urine dipstick TRACE (*)    Leukocytes, UA SMALL (*)    Squamous Epithelial / LPF 6-30 (*)    Bacteria, UA MANY (*)    All other components within normal limits  COMPREHENSIVE METABOLIC PANEL - Abnormal; Notable for the following:    Sodium 134 (*)    Total Protein 8.3 (*)    All other components within normal limits  RAPID STREP SCREEN (NOT AT [facility])  RAPID INFLUENZA A&B ANTIGENS ([facility] ONLY)  CULTURE, GROUP A STREP ([facility])  URINE CULTURE  CBC WITH DIFFERENTIAL/PLATELET  LIPASE, BLOOD  MONONUCLEOSIS SCREEN  PREGNANCY, URINE    EKG  EKG Interpretation None      ED ECG REPORT I, Andras Grunewald H, the attending physician, personally viewed and interpreted this ECG.   Date: 05/18/2016  EKG Time: 20:08:58  Rate: 86  Rhythm: normal EKG, normal sinus rhythm, there are no previous tracings available for comparison  Axis: -1  Intervals:none  ST&T Change: none Radiology Dg Abd Acute W/chest  Result Date: 05/18/2016 CLINICAL DATA:  Flu like symptoms with cough and low-grade fevers. Mid chest pain times 2-3 months. Left upper quadrant abdominal tenderness. EXAM: DG ABDOMEN ACUTE W/ 1V CHEST COMPARISON:  None. FINDINGS: There is no evidence of dilated bowel loops or free intraperitoneal air. There is increased stool throughout large bowel consistent with constipation. No radiopaque calculi or other significant radiographic abnormality is seen. Heart size and mediastinal contours are within normal limits. Both lungs are clear. Lumbosacral transitional vertebral anatomy. The lowest lumbar vertebra will be labeled S1 and demonstrates spina bifida occulta. IMPRESSION: Increased colonic stool burden throughout large bowel consistent with constipation. No acute cardiopulmonary disease. Electronically Signed   By: Tollie Eth M.D.   On: 05/18/2016 20:59     Procedures Procedures (including critical care time)  Medications Ordered in  UC Medications  ondansetron (ZOFRAN-ODT) disintegrating tablet 8 mg (8 mg Oral Given 05/18/16 2004)     Initial Impression / Assessment and Plan / UC Course  I have reviewed the triage vital signs and the nursing notes.  Pertinent labs & imaging results that were available during my care of the patient were reviewed by me and considered in my medical decision making (see chart for details). Results for orders placed or performed during the hospital encounter of 05/18/16  Rapid strep screen  Result Value Ref Range   Streptococcus, Group A Screen (Direct) NEGATIVE NEGATIVE  Rapid Influenza A&B Antigens ([facility] only)  Result Value Ref Range   Influenza A ([facility]) NEGATIVE NEGATIVE   Influenza B ([facility]) NEGATIVE NEGATIVE  CBC with Differential  Result Value Ref Range   WBC 7.0 3.6 - 11.0 K/uL   RBC 5.14 3.80 - 5.20 MIL/uL   Hemoglobin 14.6 12.0 - 16.0 g/dL   HCT 16.1 09.6 - 04.5 %   MCV 85.9 80.0 - 100.0 fL   MCH 28.4 26.0 - 34.0 pg   MCHC 33.1 32.0 - 36.0 g/dL   RDW 40.9 81.1 - 91.4 %   Platelets 228 150 - 440 K/uL   Neutrophils Relative % 57 %   Neutro Abs 4.0 1.4 - 6.5 K/uL   Lymphocytes Relative 33 %   Lymphs Abs 2.3 1.0 - 3.6 K/uL   Monocytes Relative 8 %   Monocytes Absolute 0.5 0.2 - 0.9 K/uL   Eosinophils Relative 1 %   Eosinophils Absolute 0.1 0 - 0.7 K/uL   Basophils Relative 1 %   Basophils Absolute 0.0 0 - 0.1 K/uL  Amylase  Result Value Ref Range   Amylase 108 (H) 28 - 100 U/L  Lipase, blood  Result Value Ref Range   Lipase 39 11 - 51 U/L  Urinalysis, Complete w Microscopic  Result Value Ref Range   Color, Urine YELLOW YELLOW   APPearance HAZY (A) CLEAR   Specific Gravity, Urine 1.015 1.005 - 1.030   pH 7.0 5.0 - 8.0   Glucose, UA NEGATIVE NEGATIVE mg/dL   Hgb urine dipstick TRACE (A) NEGATIVE   Bilirubin Urine NEGATIVE NEGATIVE   Ketones, ur NEGATIVE NEGATIVE mg/dL    Protein, ur NEGATIVE NEGATIVE mg/dL   Nitrite NEGATIVE NEGATIVE   Leukocytes, UA SMALL (A) NEGATIVE   Squamous Epithelial / LPF 6-30 (A) NONE SEEN   WBC, UA 6-30 0 - 5 WBC/hpf   RBC / HPF 6-30 0 - 5 RBC/hpf   Bacteria, UA MANY (A) NONE SEEN  Mononucleosis screen  Result Value Ref Range   Mono Screen NEGATIVE NEGATIVE  Comprehensive metabolic panel  Result Value Ref Range   Sodium 134 (L) 135 - 145 mmol/L   Potassium 3.7 3.5 - 5.1 mmol/L   Chloride 102 101 - 111 mmol/L   CO2 23 22 - 32 mmol/L   Glucose, Bld 91 65 - 99 mg/dL   BUN 7 6 - 20 mg/dL   Creatinine, Ser 7.82 0.44 - 1.00 mg/dL   Calcium 9.4 8.9 - 95.6 mg/dL   Total Protein 8.3 (H) 6.5 - 8.1 g/dL   Albumin 4.8 3.5 - 5.0 g/dL   AST 35 15 - 41 U/L   ALT 33 14 - 54 U/L   Alkaline Phosphatase 50 38 - 126 U/L   Total Bilirubin 1.1 0.3 - 1.2 mg/dL   GFR calc non Af Amer >60 >60 mL/min   GFR calc Af Amer >60 >60 mL/min  Anion gap 9 5 - 15  Pregnancy, urine  Result Value Ref Range   Preg Test, Ur NEGATIVE NEGATIVE   Clinical Course   Patient with a multitude of problems and questions. The biggest concern I have at this time is the chest pain in the splenomegaly. Will obtain EKG which was normal and await three-way of the abdomen and that test  Final Clinical Impressions(s) / UC Diagnoses   Final diagnoses:  Pain of upper abdomen  Non-intractable vomiting with nausea, unspecified vomiting type  Other constipation  Acute cystitis with hematuria  Chest pain in adult    New Prescriptions Discharge Medication List as of 05/18/2016  9:29 PM    START taking these medications   Details  ciprofloxacin (CIPRO) 500 MG tablet Take 1 tablet (500 mg total) by mouth 2 (two) times daily., Starting Thu 05/18/2016, Normal    magnesium citrate SOLN Take 296 mLs (1 Bottle total) by mouth once., Starting Thu 05/18/2016, Normal    ondansetron (ZOFRAN ODT) 8 MG disintegrating tablet Take 1 tablet (8 mg total) by mouth every 8 (eight) hours  as needed for nausea or vomiting., Starting Thu 05/18/2016, Normal    phenazopyridine (PYRIDIUM) 200 MG tablet Take 1 tablet (200 mg total) by mouth 3 (three) times daily as needed for pain., Starting Thu 05/18/2016, Normal       After extensive workup and time spent with patient patient has constipation which may be the cause of the left upper quadrant pain and even the false finding of suspected splenomegaly may have been coming from the retained fecal material. Recommends follow-up for PCP N1 to 2 weeks so they can make sure that place mostly megaly is no longer present. Reassure that EKG and chest x-ray shows no signs of any cardiac disease and she does have a UTI which may be caused her to have some of the left-sided back pain wasn't coming from the constipation. We will place him on Cipro for the UTI mag citrate for constipation Zofran for nausea and Pyridium for the UTI     Hassan Rowan, MD 05/18/16 2137

## 2016-05-20 LAB — URINE CULTURE
Culture: <10000 — AB
Special Requests: NORMAL

## 2016-05-21 LAB — CULTURE, GROUP A STREP (THRC)

## 2016-05-27 ENCOUNTER — Ambulatory Visit
Admission: EM | Admit: 2016-05-27 | Discharge: 2016-05-27 | Disposition: A | Discharge status: Home / Self Care | Payer: BLUE CROSS/BLUE SHIELD | Attending: Emergency Medicine | Admitting: Emergency Medicine

## 2016-05-27 DIAGNOSIS — S29019A Strain of muscle and tendon of unspecified wall of thorax, initial encounter: Secondary | ICD-10-CM

## 2016-05-27 MED ORDER — METAXALONE 800 MG PO TABS: 800.0000 mg | ORAL_TABLET | Freq: Three times a day (TID) | ORAL | 0 refills | Status: DC

## 2016-05-27 MED ORDER — DICLOFENAC SODIUM 75 MG PO TBEC: 75.0000 mg | DELAYED_RELEASE_TABLET | Freq: Two times a day (BID) | ORAL | 0 refills | Status: DC

## 2016-05-27 MED ORDER — TRAMADOL HCL 50 MG PO TABS: 50.0000 mg | ORAL_TABLET | Freq: Four times a day (QID) | ORAL | 0 refills | Status: DC | PRN

## 2016-05-27 NOTE — Discharge Instructions (Signed)
Take 1 g of Tylenol with the diclofenac. This is an effective combination for pain. You may take 1 g of Tylenol up to 2 additional times per day. Do not exceed 4 g of Tylenol from all sources in one day
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
## 2016-06-21 ENCOUNTER — Encounter: Payer: Self-pay | Admitting: *Deleted

## 2016-06-21 ENCOUNTER — Ambulatory Visit
Admission: EM | Admit: 2016-06-21 | Discharge: 2016-06-21 | Disposition: A | Discharge status: Home / Self Care | Payer: BLUE CROSS/BLUE SHIELD | Attending: Family Medicine | Admitting: Family Medicine

## 2016-06-21 DIAGNOSIS — J029 Acute pharyngitis, unspecified: Secondary | ICD-10-CM | POA: Diagnosis not present

## 2016-06-21 DIAGNOSIS — J069 Acute upper respiratory infection, unspecified: Secondary | ICD-10-CM | POA: Diagnosis not present

## 2016-06-21 DIAGNOSIS — H6693 Otitis media, unspecified, bilateral: Secondary | ICD-10-CM | POA: Diagnosis not present

## 2016-06-21 LAB — RAPID STREP SCREEN (MED CTR MEBANE ONLY): Streptococcus, Group A Screen (Direct): NEGATIVE

## 2016-06-21 LAB — MONONUCLEOSIS SCREEN: Mono Screen: NEGATIVE

## 2016-06-21 MED ORDER — AMOXICILLIN-POT CLAVULANATE 875-125 MG PO TABS: 1.0000 | ORAL_TABLET | Freq: Two times a day (BID) | ORAL | 0 refills | Status: DC

## 2016-06-21 NOTE — ED Provider Notes (Signed)
[facility] ____________________________________________  Time seen: Approximately 4:28 PM  I have reviewed the triage vital signs and the nursing notes.   HISTORY  Chief Complaint Sore Throat; Cough; and Nasal Congestion   HPI Kelly Ford is a 21 y.o. female presenting for the complaint of 5-6 days of runny nose, nasal congestion, sore throat, cough. Patient reports the last 2 days she has had bilateral ear discomfort, right greater than left. Denies hearing changes. Denies tinnitus. Denies ear drainage or trauma. Patient denies fevers. Reports overall continues to eat and drink well, but states it does hurt to swallow due to sore throat. Denies known sick contacts. Denies recent sickness. Reports symptoms resolve over-the-counter cough and congestion medications.  Denies chest pain, shortness of breath, abdominal pain, dysuria, extremity pain, extremity swelling or rash. Denies recent sickness. Denies recent antibiotic use.   Patient's last menstrual period was 05/19/2016.: Denies pregnancy.   Past Medical History:  Diagnosis Date  . Strep pharyngitis     Patient Active Problem List   Diagnosis Date Noted  . Anovulatory cycle 07/01/2015  . Constipation 07/01/2015  . Cystitis 06/22/2015  . Allergic rhinitis 06/21/2015  . Acid reflux 06/21/2015    Past Surgical History:  Procedure Laterality Date  . adnoids       No current facility-administered medications for this encounter.   Current Outpatient Prescriptions:  .  ascorbic acid (VITAMIN C) 1000 MG tablet, Take by mouth., Disp: , Rfl:  .  Biotin 10 MG CAPS, Take by mouth., Disp: , Rfl:  .  Cholecalciferol (VITAMIN D3) 400 units CAPS, Take by mouth., Disp: , Rfl:  .  GLUCOSAMINE SULFATE PO, Take by mouth., Disp: , Rfl:  .  Lysine HCl 1000 MG TABS, Take 2,000 mg by mouth. , Disp: , Rfl:  .  Multiple Vitamin (MULTIVITAMIN) capsule, Take 1 capsule by mouth daily., Disp: , Rfl:  .  pyridOXINE  (VITAMIN B-6) 100 MG tablet, Take 100 mg by mouth daily., Disp: , Rfl:  .  Vitamin E 100 units TABS, Take by mouth., Disp: , Rfl:  .  amoxicillin-clavulanate (AUGMENTIN) 875-125 MG tablet, Take 1 tablet by mouth every 12 (twelve) hours., Disp: 20 tablet, Rfl: 0 .  Cetirizine HCl (ZYRTEC ALLERGY) 10 MG CAPS, Take by mouth. Reported on 07/01/2015, Disp: , Rfl:  .  diclofenac (VOLTAREN) 75 MG EC tablet, Take 1 tablet (75 mg total) by mouth 2 (two) times daily. Take with food, Disp: 30 tablet, Rfl: 0 .  diphenhydrAMINE (BENADRYL) 25 mg capsule, Take 25 mg by mouth every 6 (six) hours as needed. Reported on 07/01/2015, Disp: , Rfl:  .  fexofenadine-pseudoephedrine (ALLEGRA-D ALLERGY & CONGESTION) 180-240 MG 24 hr tablet, Take 1 tablet by mouth daily., Disp: 30 tablet, Rfl: 0 .  metaxalone (SKELAXIN) 800 MG tablet, Take 1 tablet (800 mg total) by mouth 3 (three) times daily., Disp: 21 tablet, Rfl: 0 .  traMADol (ULTRAM) 50 MG tablet, Take 1 tablet (50 mg total) by mouth every 6 (six) hours as needed., Disp: 20 tablet, Rfl: 0  Allergies Patient has no known allergies.  Family History  Problem Relation Age of Onset  . Breast cancer Mother   . Hyperlipidemia Father   . Alzheimer's disease Paternal Grandfather     Social History Social History  Substance Use Topics  . Smoking status: Never Smoker  . Smokeless tobacco: Never Used  . Alcohol use No    Review of Systems Constitutional: No fever/chills Eyes: No visual changes. ENTAs  above. Cardiovascular: Denies chest pain. Respiratory: Denies shortness of breath. Gastrointestinal: No abdominal pain.  No nausea, no vomiting.  No diarrhea.  No constipation. Genitourinary: Negative for dysuria. Musculoskeletal: Negative for back pain. Skin: Negative for rash. Neurological: Negative for headaches, focal weakness or numbness.  10-point ROS otherwise negative.  ____________________________________________   PHYSICAL EXAM:  VITAL SIGNS: ED  Triage Vitals  Enc Vitals Group     BP 06/21/16 1450 108/60     Pulse Rate 06/21/16 1450 75     Resp 06/21/16 1450 16     Temp 06/21/16 1450 97.8 F (36.6 C)     Temp Source 06/21/16 1450 Oral     SpO2 06/21/16 1450 100 %     Weight 06/21/16 1452 107 lb (48.5 kg)     Height 06/21/16 1452 5\' 1"  (1.549 m)     Head Circumference --      Peak Flow --      Pain Score 06/21/16 1456 7     Pain Loc --      Pain Edu? --      Excl. in GC? --     Constitutional: Alert and oriented. Well appearing and in no acute distress. Eyes: Conjunctivae are normal. PERRL. EOMI. Head: Atraumatic. No sinus tenderness to palpation. No swelling. No erythema.  Ears: Left: Nontender, canal clear, mild to moderate erythema with dullness of TM, otherwise normal TM. Right: Mild tenderness to auricle movement, canal clear, mild to moderate erythema and dullness of TMs otherwise normal TM. No surrounding erythema, tenderness or edema noted.   Nose:Nasal congestion with clear rhinorrhea  Mouth/Throat: Mucous membranes are moist. Mild pharyngeal erythema.Mild bilateral tonsillar swelling. No exudate. No uvular shift or deviation.  Neck: No stridor.  No cervical spine tenderness to palpation. Hematological/Lymphatic/Immunilogical: Mild anterior bilateral cervical lymphadenopathy. Cardiovascular: Normal rate, regular rhythm. Grossly normal heart sounds.  Good peripheral circulation. Respiratory: Normal respiratory effort.  No retractions. No wheezes, rales or rhonchi. Good air movement.  Gastrointestinal: Soft and nontender.  No CVA tenderness. No hepatosplenomegaly palpated. Musculoskeletal: Ambulatory with steady gait. No cervical, thoracic or lumbar tenderness to palpation. Neurologic:  Normal speech and language. No gait instability. Skin:  Skin appears warm, dry and intact. No rash noted. Psychiatric: Mood and affect are normal. Speech and behavior are normal. ___________________________________________    LABS (all labs ordered are listed, but only abnormal results are displayed)  Labs Reviewed  RAPID STREP SCREEN (NOT AT [facility])  CULTURE, GROUP A STREP [facility])  MONONUCLEOSIS SCREEN    PROCEDURES Procedures    INITIAL IMPRESSION / ASSESSMENT AND PLAN / ED COURSE  Pertinent labs & imaging results that were available during my care of the patient were reviewed by me and considered in my medical decision making (see chart for details).  Well-appearing patient. No acute distress. Bilateral otitis media noted. Suspect upper respiratory infection. Patient reported pharyngitis, quick strep negative, will culture. After discussing negative strep swab, patient expresses concern mono, but denies mono contact. Will evaluate mono. Will treat patient with oral Augmentin, encourage rest, fluids and supportive care. Work note given for today and tomorrow. Discussed indication, risks and benefits of medications with patient.  Discussed follow up with Primary care physician this week. Discussed follow up and return parameters including no resolution or any worsening concerns. Patient verbalized understanding and agreed to plan.   ____________________________________________   FINAL CLINICAL IMPRESSION(S) / ED DIAGNOSES  Final diagnoses:  Bilateral otitis media, unspecified otitis media type  Pharyngitis, unspecified etiology  Upper respiratory tract infection, unspecified type     Discharge Medication List as of 06/21/2016  4:31 PM    START taking these medications   Details  amoxicillin-clavulanate (AUGMENTIN) 875-125 MG tablet Take 1 tablet by mouth every 12 (twelve) hours., Starting Wed 06/21/2016, Normal        Note: This dictation was prepared with Dragon dictation along with smaller phrase technology. Any transcriptional errors that result from this process are unintentional.         Renford Dills, NP 06/21/16 1952

## 2016-06-21 NOTE — Discharge Instructions (Signed)
Take medication as prescribed. Rest. Drink plenty of fluids.  ° °Follow up with your primary care physician this week as needed. Return to Urgent care for new or worsening concerns.  ° °

## 2016-06-21 NOTE — ED Triage Notes (Signed)
Patient started having symptoms of sore throat, cough, nasal congestion, and bilateral ear fullness 5 days ago.

## 2016-06-24 LAB — CULTURE, GROUP A STREP (THRC)

## 2016-07-10 ENCOUNTER — Ambulatory Visit
Admission: EM | Admit: 2016-07-10 | Discharge: 2016-07-10 | Disposition: A | Discharge status: Home / Self Care | Payer: BLUE CROSS/BLUE SHIELD | Attending: Family Medicine | Admitting: Family Medicine

## 2016-07-10 DIAGNOSIS — R69 Illness, unspecified: Secondary | ICD-10-CM | POA: Diagnosis not present

## 2016-07-10 DIAGNOSIS — J111 Influenza due to unidentified influenza virus with other respiratory manifestations: Secondary | ICD-10-CM

## 2016-07-10 LAB — RAPID STREP SCREEN (MED CTR MEBANE ONLY): Streptococcus, Group A Screen (Direct): NEGATIVE

## 2016-07-10 MED ORDER — OSELTAMIVIR PHOSPHATE 75 MG PO CAPS: 75.0000 mg | ORAL_CAPSULE | Freq: Two times a day (BID) | ORAL | 0 refills | Status: DC

## 2016-07-10 NOTE — ED Provider Notes (Signed)
[facility] ____________________________________________  Time seen: Approximately 7PM  I have reviewed the triage vital signs and the nursing notes.   HISTORY  Chief Complaint Sore Throat   HPI Kelly Ford is a 21 y.o. female  Presenting for complaints of cough, chills, body aches, sore throat since evening yesterday. Reports quick onset. Reports had fevers intermittently, subjective fevers, states did not measure.  Reports otc ibuprofen and tylenol has helped with symptoms. 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 Reports has continued to remained active, eat and drink well. Reports had ear infection a few weeks ago, and reports that discomfort resolved.   Denies chest pain, shortness of breath, abdominal pain, dysuria, extremity pain, extremity swelling or rash. Denies recent sickness.   Patient's last menstrual period was 06/19/2016. Denies pregnancy.   Lorie Phenix, MD: PCP  Past Medical History:  Diagnosis Date  . Strep pharyngitis     Patient Active Problem List   Diagnosis Date Noted  . Anovulatory cycle 07/01/2015  . Constipation 07/01/2015  . Cystitis 06/22/2015  . Allergic rhinitis 06/21/2015  . Acid reflux 06/21/2015    Past Surgical History:  Procedure Laterality Date  . adnoids       No current facility-administered medications for this encounter.   Current Outpatient Prescriptions:  .  ascorbic acid (VITAMIN C) 1000 MG tablet, Take by mouth., Disp: , Rfl:  .  Biotin 10 MG CAPS, Take by mouth., Disp: , Rfl:  .  Cetirizine HCl (ZYRTEC ALLERGY) 10 MG CAPS, Take by mouth. Reported on 07/01/2015, Disp: , Rfl:  .  Cholecalciferol (VITAMIN D3) 400 units CAPS, Take by mouth., Disp: , Rfl:  .  diphenhydrAMINE (BENADRYL) 25 mg capsule, Take 25 mg by mouth every 6 (six) hours as needed. Reported on 07/01/2015, Disp: , Rfl:  .  fexofenadine-pseudoephedrine (ALLEGRA-D ALLERGY & CONGESTION) 180-240 MG 24 hr tablet, Take 1  tablet by mouth daily., Disp: 30 tablet, Rfl: 0 .  GLUCOSAMINE SULFATE PO, Take by mouth., Disp: , Rfl:  .  Lysine HCl 1000 MG TABS, Take 2,000 mg by mouth. , Disp: , Rfl:  .  Multiple Vitamin (MULTIVITAMIN) capsule, Take 1 capsule by mouth daily., Disp: , Rfl:  .  pyridOXINE (VITAMIN B-6) 100 MG tablet, Take 100 mg by mouth daily., Disp: , Rfl:  .  Vitamin E 100 units TABS, Take by mouth., Disp: , Rfl:  .  amoxicillin-clavulanate (AUGMENTIN) 875-125 MG tablet, Take 1 tablet by mouth every 12 (twelve) hours., Disp: 20 tablet, Rfl: 0 .  diclofenac (VOLTAREN) 75 MG EC tablet, Take 1 tablet (75 mg total) by mouth 2 (two) times daily. Take with food, Disp: 30 tablet, Rfl: 0 .  metaxalone (SKELAXIN) 800 MG tablet, Take 1 tablet (800 mg total) by mouth 3 (three) times daily., Disp: 21 tablet, Rfl: 0 .  oseltamivir (TAMIFLU) 75 MG capsule, Take 1 capsule (75 mg total) by mouth every 12 (twelve) hours., Disp: 10 capsule, Rfl: 0 .  traMADol (ULTRAM) 50 MG tablet, Take 1 tablet (50 mg total) by mouth every 6 (six) hours as needed., Disp: 20 tablet, Rfl: 0  Allergies Patient has no known allergies.  Family History  Problem Relation Age of Onset  . Breast cancer Mother   . Hyperlipidemia Father   . Alzheimer's disease Paternal Grandfather     Social History Social History  Substance Use Topics  . Smoking status: Never Smoker  . Smokeless tobacco: Never Used  . Alcohol use No  Review of Systems Constitutional: As above.  Eyes: No visual changes. ENT: Positive sore throat. Cardiovascular: Denies chest pain. Respiratory: Denies shortness of breath. Gastrointestinal: No abdominal pain.  No nausea, no vomiting.  No diarrhea.  No constipation. Genitourinary: Negative for dysuria. Musculoskeletal: Negative for back pain. Skin: Negative for rash. Neurological: Negative for headaches, focal weakness or numbness.  10-point ROS otherwise  negative.  ____________________________________________   PHYSICAL EXAM:  VITAL SIGNS: ED Triage Vitals  Enc Vitals Group     BP 07/10/16 1857 (!) 93/55     Pulse Rate 07/10/16 1857 99     Resp 07/10/16 1857 18     Temp 07/10/16 1857 98.1 F (36.7 C)     Temp Source 07/10/16 1857 Oral     SpO2 07/10/16 1857 100 %     Weight 07/10/16 1855 107 lb (48.5 kg)     Height 07/10/16 1855 5\' 1"  (1.549 m)     Head Circumference --      Peak Flow --      Pain Score 07/10/16 1857 6     Pain Loc --      Pain Edu? --      Excl. in GC? --     Constitutional: Alert and oriented. Well appearing and in no acute distress. Eyes: Conjunctivae are normal. PERRL. EOMI. Head: Atraumatic. No sinus tenderness to palpation. No swelling. No erythema.  Ears: no erythema, normal TMs bilaterally.   Nose:Nasal congestion with clear rhinorrhea  Mouth/Throat: Mucous membranes are moist. Mild pharyngeal erythema. Mild bilateral tonsillar swelling. No exudate.  Neck: No stridor.  No cervical spine tenderness to palpation. Hematological/Lymphatic/Immunilogical: No cervical lymphadenopathy. Cardiovascular: Normal rate, regular rhythm. Grossly normal heart sounds.  Good peripheral circulation. Respiratory: Normal respiratory effort.  No retractions. No wheezes, rales or rhonchi. Good air movement.  Gastrointestinal: Soft and nontender. Normal Bowel sounds. No CVA tenderness. Musculoskeletal: Ambulatory with steady gait. No cervical, thoracic or lumbar tenderness to palpation. Neurologic:  Normal speech and language. No gait instability. Skin:  Skin appears warm, dry and intact. No rash noted. Psychiatric: Mood and affect are normal. Speech and behavior are normal.  ___________________________________________   LABS (all labs ordered are listed, but only abnormal results are displayed)  Labs Reviewed  RAPID STREP SCREEN (NOT AT [facility])  CULTURE, GROUP A STREP [facility])     PROCEDURES Procedures     INITIAL IMPRESSION / ASSESSMENT AND PLAN / ED COURSE  Pertinent labs & imaging results that were available during my care of the patient were reviewed by me and considered in my medical decision making (see chart for details).  Well-appearing patient. No acute distress. Quick strep negative, will culture. Discussed with patient suspect influenza-like illness. Discussed evaluation treatment options with patient. She patient with oral Tamiflu. Encouraged rest, fluids, over-the-counter medication use as needed. Work note given for today and tomorrow.Discussed indication, risks and benefits of medications with patient.  Discussed follow up with Primary care physician this week. Discussed follow up and return parameters including no resolution or any worsening concerns. Patient verbalized understanding and agreed to plan.   ____________________________________________   FINAL CLINICAL IMPRESSION(S) / ED DIAGNOSES  Final diagnoses:  Influenza-like illness     Discharge Medication List as of 07/10/2016  7:22 PM    START taking these medications   Details  oseltamivir (TAMIFLU) 75 MG capsule Take 1 capsule (75 mg total) by mouth every 12 (twelve) hours., Starting Mon 07/10/2016, Normal        Note: This  dictation was prepared with Dragon dictation along with smaller phrase technology. Any transcriptional errors that result from this process are unintentional.         Renford DillsLindsey Leen Tworek, NP 07/12/16 [phone]

## 2016-07-10 NOTE — Discharge Instructions (Signed)
Take medication as prescribed. Rest. Drink plenty of fluids.  ° °Follow up with your primary care physician this week as needed. Return to Urgent care for new or worsening concerns.  ° °

## 2016-07-10 NOTE — ED Triage Notes (Signed)
Patient complains of sore throat, headaches, congestion, body aches, ear aches. Patient states that symptom started yesterday and has been worse with swallowing.

## 2016-07-13 LAB — CULTURE, GROUP A STREP (THRC)

## 2016-09-26 NOTE — Discharge Instructions (Signed)
T & A INSTRUCTION SHEET - [facility] °[facility] ° °CREIGHTON VAUGHT, MD °PAUL H. JUENGEL, MD  °P. [facility]T °CHAPMAN MCQUEEN, MD ° °[address] [place], [place] [address] TEL. [phone] °[address] [place] [address] [phone] ° °INFORMATION SHEET FOR A TONSILLECTOMY AND ADENDOIDECTOMY ° °About Your Tonsils and Adenoids ° The tonsils and adenoids are normal body tissues that are part of our immune system.  They normally help to protect us against diseases that may enter our mouth and nose.  However, sometimes the tonsils and/or adenoids become too large and obstruct our breathing, especially at night. °  ° If either of these things happen it helps to remove the tonsils and adenoids in order to become healthier. The operation to remove the tonsils and adenoids is called a tonsillectomy and adenoidectomy. ° °The Location of Your Tonsils and Adenoids ° The tonsils are located in the back of the throat on both side and sit in a cradle of muscles. The adenoids are located in the roof of the mouth, behind the nose, and closely associated with the opening of the Eustachian tube to the ear. ° °Surgery on Tonsils and Adenoids ° A tonsillectomy and adenoidectomy is a short operation which takes about thirty minutes.  This includes being put to sleep and being awakened.  Tonsillectomies and adenoidectomies are performed at [facility] and may require observation period in the recovery room prior to going home. ° °Following the Operation for a Tonsillectomy ° A cautery machine is used to control bleeding.  Bleeding from a tonsillectomy and adenoidectomy is minimal and postoperatively the risk of bleeding is approximately four percent, although this rarely life threatening. ° ° ° °After your tonsillectomy and adenoidectomy post-op care at home: ° °1. Our patients are able to go home the same day.  You may be given prescriptions for pain  medications and antibiotics, if indicated. °2. It is extremely important to remember that fluid intake is of utmost importance after a tonsillectomy.  The amount that you drink must be maintained in the postoperative period.  A good indication of whether a child is getting enough fluid is whether his/her urine output is constant.  As long as children are urinating or wetting their diaper every 6 - 8 hours this is usually enough fluid intake.   °3. Although rare, this is a risk of some bleeding in the first ten days after surgery.  This is usually occurs between day five and nine postoperatively.  This risk of bleeding is approximately four percent.  If you or your child should have any bleeding you should remain calm and notify our office or go directly to the Emergency Room at [facility] where they will contact us. Our doctors are available seven days a week for notification.  We recommend sitting up quietly in a chair, place an ice pack on the front of the neck and spitting out the blood gently until we are able to contact you.  Adults should gargle gently with ice water and this may help stop the bleeding.  If the bleeding does not stop after a short time, i.e. 10 to 15 minutes, or seems to be increasing again, please contact us or go to the hospital.   °4. It is common for the pain to be worse at 5 - 7 days postoperatively.  This occurs because the “scab” is peeling off and the mucous membrane (skin of   the throat) is growing back where the tonsils were.   °5. It is common for a low-grade fever, less than 102, during the first week after a tonsillectomy and adenoidectomy.  It is usually due to not drinking enough liquids, and we suggest your use liquid Tylenol or the pain medicine with Tylenol prescribed in order to keep your temperature below 102.  Please follow the directions on the back of the bottle. °6. Do not take aspirin or any products that contain aspirin such as Bufferin, Anacin,  Ecotrin, aspirin gum, Goodies, BC headache powders, etc., after a T&A because it can promote bleeding.  Please check with our office before administering any other medication that may been prescribed by other doctors during the two week post-operative period. °7. If you happen to look in the mirror or into your child’s mouth you will see white/gray patches on the back of the throat.  This is what a scab looks like in the mouth and is normal after having a T&A.  It will disappear once the tonsil area heals completely. However, it may cause a noticeable odor, and this too will disappear with time.     °8. You or your child may experience ear pain after having a T&A.  This is called referred pain and comes from the throat, but it is felt in the ears.  Ear pain is quite common and expected.  It will usually go away after ten days.  There is usually nothing wrong with the ears, and it is primarily due to the healing area stimulating the nerve to the ear that runs along the side of the throat.  Use either the prescribed pain medicine or Tylenol as needed.  °9. The throat tissues after a tonsillectomy are obviously sensitive.  Smoking around children who have had a tonsillectomy significantly increases the risk of bleeding.  DO NOT SMOKE!  ° °General Anesthesia, Adult, Care After °These instructions provide you with information about caring for yourself after your procedure. Your health care provider may also give you more specific instructions. Your treatment has been planned according to current medical practices, but problems sometimes occur. Call your health care provider if you have any problems or questions after your procedure. °What can I expect after the procedure? °After the procedure, it is common to have: °· Vomiting. °· A sore throat. °· Mental slowness. °It is common to feel: °· Nauseous. °· Cold or shivery. °· Sleepy. °· Tired. °· Sore or achy, even in parts of your body where you did not have  surgery. °Follow these instructions at home: °For at least 24 hours after the procedure:  °· Do not: °¨ Participate in activities where you could fall or become injured. °¨ Drive. °¨ Use heavy machinery. °¨ Drink alcohol. °¨ Take sleeping pills or medicines that cause drowsiness. °¨ Make important decisions or sign legal documents. °¨ Take care of children on your own. °· Rest. °Eating and drinking  °· If you vomit, drink water, juice, or soup when you can drink without vomiting. °· Drink enough fluid to keep your urine clear or pale yellow. °· Make sure you have little or no nausea before eating solid foods. °· Follow the diet recommended by your health care provider. °General instructions  °· Have a responsible adult stay with you until you are awake and alert. °· Return to your normal activities as told by your health care provider. Ask your health care provider what activities are safe for you. °· Take over-the-counter   and prescription medicines only as told by your health care provider. °· If you smoke, do not smoke without supervision. °· Keep all follow-up visits as told by your health care provider. This is important. °Contact a health care provider if: °· You continue to have nausea or vomiting at home, and medicines are not helpful. °· You cannot drink fluids or start eating again. °· You cannot urinate after 8-12 hours. °· You develop a skin rash. °· You have fever. °· You have increasing redness at the site of your procedure. °Get help right away if: °· You have difficulty breathing. °· You have chest pain. °· You have unexpected bleeding. °· You feel that you are having a life-threatening or urgent problem. °This information is not intended to replace advice given to you by your health care provider. Make sure you discuss any questions you have with your health care provider. °Document Released: 08/07/2000 Document Revised: 10/04/2015 Document Reviewed: 04/15/2015 °[employer] Interactive Patient Education  © 2017 [employer] ° °

## 2016-09-28 ENCOUNTER — Ambulatory Visit: Payer: BLUE CROSS/BLUE SHIELD | Admitting: Anesthesiology

## 2016-09-28 ENCOUNTER — Encounter
Admission: RE | Disposition: A | Discharge status: Home / Self Care | Payer: Self-pay | Source: Ambulatory Visit | Attending: Otolaryngology

## 2016-09-28 ENCOUNTER — Ambulatory Visit
Admission: RE | Admit: 2016-09-28 | Discharge: 2016-09-28 | Disposition: A | Discharge status: Home / Self Care | Payer: BLUE CROSS/BLUE SHIELD | Source: Ambulatory Visit | Attending: Otolaryngology | Admitting: Otolaryngology

## 2016-09-28 DIAGNOSIS — J3501 Chronic tonsillitis: Secondary | ICD-10-CM | POA: Diagnosis not present

## 2016-09-28 DIAGNOSIS — R590 Localized enlarged lymph nodes: Secondary | ICD-10-CM | POA: Insufficient documentation

## 2016-09-28 HISTORY — DX: Chest pain, unspecified: R07.9

## 2016-09-28 HISTORY — DX: Gastro-esophageal reflux disease without esophagitis: K21.9

## 2016-09-28 HISTORY — PX: TONSILLECTOMY: SHX5217

## 2016-09-28 SURGERY — TONSILLECTOMY
Anesthesia: General | Wound class: Clean Contaminated

## 2016-09-28 MED ORDER — GLYCOPYRROLATE 0.2 MG/ML IJ SOLN
INTRAMUSCULAR | Status: DC | PRN
  Administered 2016-09-28: 0.2 mg via INTRAVENOUS

## 2016-09-28 MED ORDER — DEXAMETHASONE SODIUM PHOSPHATE 4 MG/ML IJ SOLN
INTRAMUSCULAR | Status: DC | PRN
  Administered 2016-09-28: 8 mg via INTRAVENOUS

## 2016-09-28 MED ORDER — FENTANYL CITRATE (PF) 100 MCG/2ML IJ SOLN: 50.0000 ug | INTRAMUSCULAR | Status: DC | PRN

## 2016-09-28 MED ORDER — OXYCODONE HCL 5 MG PO TABS: 5.0000 mg | ORAL_TABLET | Freq: Once | ORAL | Status: AC | PRN

## 2016-09-28 MED ORDER — MIDAZOLAM HCL 5 MG/5ML IJ SOLN
INTRAMUSCULAR | Status: DC | PRN
  Administered 2016-09-28: 2 mg via INTRAVENOUS

## 2016-09-28 MED ORDER — LIDOCAINE HCL (CARDIAC) 20 MG/ML IV SOLN
INTRAVENOUS | Status: DC | PRN
  Administered 2016-09-28: 40 mg via INTRAVENOUS

## 2016-09-28 MED ORDER — OXYCODONE HCL 5 MG/5ML PO SOLN
5.0000 mg | Freq: Once | ORAL | Status: AC | PRN
  Administered 2016-09-28: 5 mg via ORAL

## 2016-09-28 MED ORDER — ONDANSETRON HCL 4 MG/2ML IJ SOLN: 4.0000 mg | Freq: Once | INTRAMUSCULAR | Status: DC

## 2016-09-28 MED ORDER — ACETAMINOPHEN 10 MG/ML IV SOLN
1000.0000 mg | Freq: Once | INTRAVENOUS | Status: AC
  Administered 2016-09-28: 1000 mg via INTRAVENOUS

## 2016-09-28 MED ORDER — PROPOFOL 10 MG/ML IV BOLUS
INTRAVENOUS | Status: DC | PRN
  Administered 2016-09-28: 200 mg via INTRAVENOUS

## 2016-09-28 MED ORDER — LACTATED RINGERS IV SOLN
INTRAVENOUS | Status: DC
  Administered 2016-09-28: 08:00:00 via INTRAVENOUS

## 2016-09-28 MED ORDER — FENTANYL CITRATE (PF) 100 MCG/2ML IJ SOLN
INTRAMUSCULAR | Status: DC | PRN
  Administered 2016-09-28: 100 ug via INTRAVENOUS

## 2016-09-28 MED ORDER — ONDANSETRON HCL 4 MG/2ML IJ SOLN
4.0000 mg | Freq: Once | INTRAMUSCULAR | Status: AC
  Administered 2016-09-28: 4 mg via INTRAVENOUS

## 2016-09-28 MED ORDER — SCOPOLAMINE 1 MG/3DAYS TD PT72
1.0000 | MEDICATED_PATCH | TRANSDERMAL | Status: DC
  Administered 2016-09-28: 1.5 mg via TRANSDERMAL

## 2016-09-28 MED ORDER — ONDANSETRON HCL 4 MG/2ML IJ SOLN
INTRAMUSCULAR | Status: DC | PRN
  Administered 2016-09-28: 4 mg via INTRAVENOUS

## 2016-09-28 MED ORDER — FENTANYL CITRATE (PF) 100 MCG/2ML IJ SOLN: 25.0000 ug | INTRAMUSCULAR | Status: DC | PRN

## 2016-09-28 SURGICAL SUPPLY — 12 items
CANISTER SUCT 1200ML W/VALVE (MISCELLANEOUS) ×3 IMPLANT
ELECT CAUTERY BLADE TIP 2.5 (TIP) ×3
ELECTRODE CAUTERY BLDE TIP 2.5 (TIP) ×1 IMPLANT
GLOVE PI ULTRA LF STRL 7.5 (GLOVE) ×1 IMPLANT
GLOVE PI ULTRA NON LATEX 7.5 (GLOVE) ×2
KIT ROOM TURNOVER OR (KITS) ×3 IMPLANT
PACK TONSIL/ADENOIDS (PACKS) ×3 IMPLANT
PAD GROUND ADULT SPLIT (MISCELLANEOUS) ×3 IMPLANT
PENCIL ELECTRO HAND CTR (MISCELLANEOUS) ×3 IMPLANT
SOL ANTI-FOG 6CC FOG-OUT (MISCELLANEOUS) ×1 IMPLANT
SOL FOG-OUT ANTI-FOG 6CC (MISCELLANEOUS) ×2
STRAP BODY AND KNEE 60X3 (MISCELLANEOUS) ×3 IMPLANT

## 2016-09-28 NOTE — Anesthesia Preprocedure Evaluation (Signed)
Anesthesia Evaluation  Patient identified by MRN, date of birth, ID band Patient awake    Reviewed: Allergy & Precautions, H&P , NPO status , Patient's Chart, lab work & pertinent test results  Airway Mallampati: II  TM Distance: >3 FB Neck ROM: full    Dental no notable dental hx.    Pulmonary neg pulmonary ROS,    Pulmonary exam normal        Cardiovascular negative cardio ROS Normal cardiovascular exam     Neuro/Psych    GI/Hepatic Neg liver ROS, Medicated,  Endo/Other  negative endocrine ROS  Renal/GU negative Renal ROS     Musculoskeletal   Abdominal   Peds  Hematology negative hematology ROS (+)   Anesthesia Other Findings   Reproductive/Obstetrics                             Anesthesia Physical Anesthesia Plan  ASA: II  Anesthesia Plan: General ETT   Post-op Pain Management:    Induction:   Airway Management Planned:   Additional Equipment:   Intra-op Plan:   Post-operative Plan:   Informed Consent: I have reviewed the patients History and Physical, chart, labs and discussed the procedure including the risks, benefits and alternatives for the proposed anesthesia with the patient or authorized representative who has indicated his/her understanding and acceptance.     Plan Discussed with:   Anesthesia Plan Comments:         Anesthesia Quick Evaluation

## 2016-09-28 NOTE — Transfer of Care (Signed)
Immediate Anesthesia Transfer of Care Note  Patient: Kelly Ford  Procedure(s) Performed: Procedure(s): TONSILLECTOMY (N/A)  Patient Location: PACU  Anesthesia Type: General ETT  Level of Consciousness: awake, alert  and patient cooperative  Airway and Oxygen Therapy: Patient Spontanous Breathing and Patient connected to supplemental oxygen  Post-op Assessment: Post-op Vital signs reviewed, Patient's Cardiovascular Status Stable, Respiratory Function Stable, Patent Airway and No signs of Nausea or vomiting  Post-op Vital Signs: Reviewed and stable  Complications: No apparent anesthesia complications

## 2016-09-28 NOTE — H&P (Signed)
H&P has been reviewedand patient reevaluated,  and no changes necessary. To be downloaded later.  

## 2016-09-28 NOTE — Anesthesia Procedure Notes (Signed)
Procedure Name: Intubation Date/Time: 09/28/2016 8:26 AM Performed by: Andee PolesBUSH, Mrytle Bento Pre-anesthesia Checklist: Patient identified, Emergency Drugs available, Suction available, Patient being monitored and Timeout performed Patient Re-evaluated:Patient Re-evaluated prior to inductionOxygen Delivery Method: Circle system utilized Preoxygenation: Pre-oxygenation with 100% oxygen Intubation Type: IV induction Ventilation: Mask ventilation without difficulty Grade View: Grade I Tube type: Oral Rae Tube size: 6.5 mm Number of attempts: 1 Placement Confirmation: ETT inserted through vocal cords under direct vision,  positive ETCO2 and breath sounds checked- equal and bilateral Tube secured with: Tape Dental Injury: Teeth and Oropharynx as per pre-operative assessment

## 2016-09-28 NOTE — Op Note (Signed)
09/28/2016  8:52 AM    Enrigue CatenaMurzda, Kelly Ford  [id number]   Pre-Op Dx:  Chronic tonsillitis, hypertrophied tonsils  Post-op Dx: Same  Proc: Tonsillectomy   Surg:  Aften Lipsey H  Anes:  GOT  EBL:  20 mL  Comp:  None  Findings:  Very large cryptic tonsils with a lot of debris in her right tonsil especially  Procedure: The patient was given general anesthesia by oral endotracheal intubation. A Davis mouth gag was used to visualize the oropharynx. The soft palate was retracted and the adenoids were visualized and these were not enlarged. The tonsils were enlarged and cryptic. They were grasped and pulled medially and the anterior pillar incised with electrocautery. Dissection of the tonsil from the tonsillar fossa was done with electrocautery and blunt dissection. Bleeding was controlled with electrocautery. Total estimated blood loss was 20 mL. The tonsil extended down to the tongue base and was removed from the tongue base as well.  The patient tolerated the procedure well. She was awakened and taken to the recovery room in satisfactory condition. There were no operative complications.  Dispo:   To PACU to be discharged home  Plan:  To follow-up in the office in a couple weeks. She'll push oral fluids to stay well-hydrated. Will use Tylenol with hydrocodone liquid for pain, supplemented by liquid Tylenol or liquid ibuprofen. Will use liquid amoxicillin twice a day for week.  Keelin Neville H  09/28/2016 8:52 AM

## 2016-09-28 NOTE — Anesthesia Postprocedure Evaluation (Signed)
Anesthesia Post Note  Patient: Kelly Ford  Procedure(s) Performed: Procedure(s) (LRB): TONSILLECTOMY (N/A)  Patient location during evaluation: PACU Anesthesia Type: General Level of consciousness: awake and alert Pain management: pain level controlled Vital Signs Assessment: post-procedure vital signs reviewed and stable Respiratory status: spontaneous breathing Cardiovascular status: blood pressure returned to baseline Postop Assessment: no headache Anesthetic complications: no    Verner Cholunkle, III,  Iban Utz D

## 2016-10-02 LAB — SURGICAL PATHOLOGY

## 2017-02-12 ENCOUNTER — Emergency Department
Admission: EM | Admit: 2017-02-12 | Discharge: 2017-02-12 | Disposition: A | Discharge status: Home / Self Care | Payer: BLUE CROSS/BLUE SHIELD | Attending: Emergency Medicine | Admitting: Emergency Medicine

## 2017-02-12 ENCOUNTER — Emergency Department: Payer: BLUE CROSS/BLUE SHIELD

## 2017-02-12 ENCOUNTER — Encounter: Payer: Self-pay | Admitting: Emergency Medicine

## 2017-02-12 DIAGNOSIS — Z79899 Other long term (current) drug therapy: Secondary | ICD-10-CM | POA: Insufficient documentation

## 2017-02-12 DIAGNOSIS — M5442 Lumbago with sciatica, left side: Secondary | ICD-10-CM | POA: Diagnosis not present

## 2017-02-12 DIAGNOSIS — M545 Low back pain: Secondary | ICD-10-CM | POA: Diagnosis present

## 2017-02-12 DIAGNOSIS — G8929 Other chronic pain: Secondary | ICD-10-CM | POA: Diagnosis not present

## 2017-02-12 LAB — URINALYSIS, COMPLETE (UACMP) WITH MICROSCOPIC
Bilirubin Urine: NEGATIVE
Glucose, UA: NEGATIVE mg/dL
Hgb urine dipstick: NEGATIVE
Ketones, ur: NEGATIVE mg/dL
Nitrite: NEGATIVE
Protein, ur: NEGATIVE mg/dL
Specific Gravity, Urine: 1.019 (ref 1.005–1.030)
pH: 5 (ref 5.0–8.0)

## 2017-02-12 LAB — POCT PREGNANCY, URINE: Preg Test, Ur: NEGATIVE

## 2017-02-12 MED ORDER — PREDNISONE 50 MG PO TABS: ORAL_TABLET | ORAL | 0 refills | Status: DC

## 2017-02-12 MED ORDER — KETOROLAC TROMETHAMINE 30 MG/ML IJ SOLN
30.0000 mg | Freq: Once | INTRAMUSCULAR | Status: AC
  Administered 2017-02-12: 30 mg via INTRAMUSCULAR
  Filled 2017-02-12: qty 1
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
## 2017-02-12 NOTE — ED Triage Notes (Signed)
Pt reports chronic back that has flared up in the past two days. Pt in no apparent distress in triage.
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
## 2017-02-28 ENCOUNTER — Other Ambulatory Visit: Payer: Self-pay | Admitting: Student

## 2017-02-28 DIAGNOSIS — M545 Low back pain: Secondary | ICD-10-CM

## 2017-02-28 DIAGNOSIS — M542 Cervicalgia: Secondary | ICD-10-CM

## 2017-03-08 ENCOUNTER — Ambulatory Visit
Admission: RE | Admit: 2017-03-08 | Discharge: 2017-03-08 | Disposition: A | Discharge status: Home / Self Care | Payer: BLUE CROSS/BLUE SHIELD | Source: Ambulatory Visit | Attending: Student | Admitting: Student

## 2017-03-08 DIAGNOSIS — M545 Low back pain: Secondary | ICD-10-CM | POA: Diagnosis not present

## 2017-03-08 DIAGNOSIS — N3289 Other specified disorders of bladder: Secondary | ICD-10-CM | POA: Insufficient documentation

## 2017-03-08 DIAGNOSIS — M542 Cervicalgia: Secondary | ICD-10-CM | POA: Diagnosis present

## 2017-03-13 ENCOUNTER — Encounter: Payer: Self-pay | Admitting: Student in an Organized Health Care Education/Training Program

## 2017-03-13 ENCOUNTER — Ambulatory Visit
Payer: BLUE CROSS/BLUE SHIELD | Attending: Student in an Organized Health Care Education/Training Program | Admitting: Student in an Organized Health Care Education/Training Program

## 2017-03-13 DIAGNOSIS — M542 Cervicalgia: Secondary | ICD-10-CM

## 2017-03-13 DIAGNOSIS — K219 Gastro-esophageal reflux disease without esophagitis: Secondary | ICD-10-CM | POA: Insufficient documentation

## 2017-03-13 DIAGNOSIS — M7918 Myalgia, other site: Secondary | ICD-10-CM | POA: Diagnosis not present

## 2017-03-13 DIAGNOSIS — M5442 Lumbago with sciatica, left side: Secondary | ICD-10-CM

## 2017-03-13 DIAGNOSIS — J309 Allergic rhinitis, unspecified: Secondary | ICD-10-CM | POA: Insufficient documentation

## 2017-03-13 DIAGNOSIS — Z79899 Other long term (current) drug therapy: Secondary | ICD-10-CM | POA: Diagnosis not present

## 2017-03-13 DIAGNOSIS — K5900 Constipation, unspecified: Secondary | ICD-10-CM | POA: Diagnosis not present

## 2017-03-13 DIAGNOSIS — G8929 Other chronic pain: Secondary | ICD-10-CM

## 2017-03-13 DIAGNOSIS — Z803 Family history of malignant neoplasm of breast: Secondary | ICD-10-CM | POA: Diagnosis not present

## 2017-03-13 DIAGNOSIS — G894 Chronic pain syndrome: Secondary | ICD-10-CM | POA: Diagnosis not present

## 2017-03-13 DIAGNOSIS — M545 Low back pain: Secondary | ICD-10-CM | POA: Diagnosis present

## 2017-03-13 MED ORDER — GABAPENTIN 300 MG PO CAPS: ORAL_CAPSULE | ORAL | 1 refills | Status: DC

## 2017-03-13 NOTE — Progress Notes (Signed)
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 this visit.   Provider-requested follow-up: Return in about 4 weeks (around 04/10/2017) for Medication Management.  Future Appointments Date Time Provider [place]  04/12/2017 9:45 AM Gillis Santa, MD [facility] None    Primary Care Physician: Patient, No Pcp Per  Location: [facility] Note by: Gillis Santa, M.D, Date: 03/13/2017; Time: 1:13 PM  Patient Instructions  1. Start Gabapentin 300 mg in the evening x 2 weeks then increase to 300 mg twice daily. If morning dose makes you too sleepy can do 600 mg in the evening as well.  2. Follow up 1 month     Gabapentin capsules or tablets What is this medicine? GABAPENTIN (GA ba pen tin) is used to control partial seizures in adults with epilepsy. It is also used to treat certain types of nerve pain. This medicine may be used for other purposes; ask  your health care provider or pharmacist if you have questions. COMMON BRAND NAME(S): Active-PAC with Gabapentin, Gabarone, Neurontin What should I tell my health care provider before I take this medicine? They need to know if you have any of these conditions: -kidney disease -suicidal thoughts, plans, or attempt; a previous suicide attempt by you or a family member -an unusual or allergic reaction to gabapentin, other medicines, foods, dyes, or preservatives -pregnant or trying to get pregnant -breast-feeding How should I use this medicine? Take this medicine by mouth with a glass of water. Follow the directions on the prescription label. You can take it with or without food. If it upsets your stomach, take it with food.Take your medicine at regular intervals. Do not take it more often than directed. Do not stop taking except on your doctor's advice. If you are directed to break the 600 or 800 mg tablets in half as part of your dose, the extra half tablet should be used for the next dose. If you have not used the extra half tablet within 28 days, it should be thrown away. A special MedGuide will be given to you by the pharmacist with each prescription and refill. Be sure to read this information carefully each time. Talk to your pediatrician regarding the use of this medicine in children. Special care may be needed. Overdosage: If you think you have taken too much of this medicine contact a poison control center or emergency room at once. NOTE: This medicine is only for you. Do not share this medicine with others. What if I miss a dose? If you miss a dose, take it as soon as you can. If it is almost time for your next dose, take only that dose. Do not take double or extra doses. What may interact with this medicine? Do not take this medicine with any of the following medications: -other gabapentin products This medicine may also interact with the following  medications: -alcohol -antacids -antihistamines for allergy, cough and cold -certain medicines for anxiety or sleep -certain medicines for depression or psychotic disturbances -homatropine; hydrocodone -naproxen -narcotic medicines (opiates) for pain -phenothiazines like chlorpromazine, mesoridazine, prochlorperazine, thioridazine This list may not describe all possible interactions. Give your health care provider a list of all the medicines, herbs, non-prescription drugs, or dietary supplements you use. Also tell them if you smoke, drink alcohol, or use illegal drugs. Some items may interact with your medicine. What should I watch for while using this medicine? Visit your doctor or health care professional for regular checks on your progress. You may want to keep a record at home of how you feel your condition is responding to treatment. You may want to share this information with your doctor or health care professional at each visit. You should contact your doctor or health care professional if your seizures get worse or if you have any  new types of seizures. Do not stop taking this medicine or any of your seizure medicines unless instructed by your doctor or health care professional. Stopping your medicine suddenly can increase your seizures or their severity. Wear a medical identification bracelet or chain if you are taking this medicine for seizures, and carry a card that lists all your medications. You may get drowsy, dizzy, or have blurred vision. Do not drive, use machinery, or do anything that needs mental alertness until you know how this medicine affects you. To reduce dizzy or fainting spells, do not sit or stand up quickly, especially if you are an older patient. Alcohol can increase drowsiness and dizziness. Avoid alcoholic drinks. Your mouth may get dry. Chewing sugarless gum or sucking hard candy, and drinking plenty of water will help. The use of this medicine may increase the chance  of suicidal thoughts or actions. Pay special attention to how you are responding while on this medicine. Any worsening of mood, or thoughts of suicide or dying should be reported to your health care professional right away. Women who become pregnant while using this medicine may enroll in the [place] Pregnancy Registry by calling [phone]. This registry collects information about the safety of antiepileptic drug use during pregnancy. What side effects may I notice from receiving this medicine? Side effects that you should report to your doctor or health care professional as soon as possible: -allergic reactions like skin rash, itching or hives, swelling of the face, lips, or tongue -worsening of mood, thoughts or actions of suicide or dying Side effects that usually do not require medical attention (report to your doctor or health care professional if they continue or are bothersome): -constipation -difficulty walking or controlling muscle movements -dizziness -nausea -slurred speech -tiredness -tremors -weight gain This list may not describe all possible side effects. Call your doctor for medical advice about side effects. You may report side effects to FDA at 1-800-FDA-1088. Where should I keep my medicine? Keep out of reach of children. This medicine may cause accidental overdose and death if it taken by other adults, children, or pets. Mix any unused medicine with a substance like cat litter or coffee grounds. Then throw the medicine away in a sealed container like a sealed bag or a coffee can with a lid. Do not use the medicine after the expiration date. Store at room temperature between 15 and 30 degrees C (59 and 86 degrees F). NOTE: This sheet is a summary. It may not cover all possible information. If you have questions about this medicine, talk to your doctor, pharmacist, or health care provider.  2018 [employer]vier/Gold Standard (2013-06-27 15:26:50)

## 2017-03-13 NOTE — Patient Instructions (Addendum)
1. Start Gabapentin 300 mg in the evening x 2 weeks then increase to 300 mg twice daily. If morning dose makes you too sleepy can do 600 mg in the evening as well.  2. Follow up 1 month     Gabapentin capsules or tablets What is this medicine? GABAPENTIN (GA ba pen tin) is used to control partial seizures in adults with epilepsy. It is also used to treat certain types of nerve pain. This medicine may be used for other purposes; ask your health care provider or pharmacist if you have questions. COMMON BRAND NAME(S): Active-PAC with Gabapentin, Gabarone, Neurontin What should I tell my health care provider before I take this medicine? They need to know if you have any of these conditions: -kidney disease -suicidal thoughts, plans, or attempt; a previous suicide attempt by you or a family member -an unusual or allergic reaction to gabapentin, other medicines, foods, dyes, or preservatives -pregnant or trying to get pregnant -breast-feeding How should I use this medicine? Take this medicine by mouth with a glass of water. Follow the directions on the prescription label. You can take it with or without food. If it upsets your stomach, take it with food.Take your medicine at regular intervals. Do not take it more often than directed. Do not stop taking except on your doctor's advice. If you are directed to break the 600 or 800 mg tablets in half as part of your dose, the extra half tablet should be used for the next dose. If you have not used the extra half tablet within 28 days, it should be thrown away. A special MedGuide will be given to you by the pharmacist with each prescription and refill. Be sure to read this information carefully each time. Talk to your pediatrician regarding the use of this medicine in children. Special care may be needed. Overdosage: If you think you have taken too much of this medicine contact a poison control center or emergency room at once. NOTE: This medicine is  only for you. Do not share this medicine with others. What if I miss a dose? If you miss a dose, take it as soon as you can. If it is almost time for your next dose, take only that dose. Do not take double or extra doses. What may interact with this medicine? Do not take this medicine with any of the following medications: -other gabapentin products This medicine may also interact with the following medications: -alcohol -antacids -antihistamines for allergy, cough and cold -certain medicines for anxiety or sleep -certain medicines for depression or psychotic disturbances -homatropine; hydrocodone -naproxen -narcotic medicines (opiates) for pain -phenothiazines like chlorpromazine, mesoridazine, prochlorperazine, thioridazine This list may not describe all possible interactions. Give your health care provider a list of all the medicines, herbs, non-prescription drugs, or dietary supplements you use. Also tell them if you smoke, drink alcohol, or use illegal drugs. Some items may interact with your medicine. What should I watch for while using this medicine? Visit your doctor or health care professional for regular checks on your progress. You may want to keep a record at home of how you feel your condition is responding to treatment. You may want to share this information with your doctor or health care professional at each visit. You should contact your doctor or health care professional if your seizures get worse or if you have any new types of seizures. Do not stop taking this medicine or any of your seizure medicines unless instructed by your doctor  or health care professional. Stopping your medicine suddenly can increase your seizures or their severity. Wear a medical identification bracelet or chain if you are taking this medicine for seizures, and carry a card that lists all your medications. You may get drowsy, dizzy, or have blurred vision. Do not drive, use machinery, or do anything  that needs mental alertness until you know how this medicine affects you. To reduce dizzy or fainting spells, do not sit or stand up quickly, especially if you are an older patient. Alcohol can increase drowsiness and dizziness. Avoid alcoholic drinks. Your mouth may get dry. Chewing sugarless gum or sucking hard candy, and drinking plenty of water will help. The use of this medicine may increase the chance of suicidal thoughts or actions. Pay special attention to how you are responding while on this medicine. Any worsening of mood, or thoughts of suicide or dying should be reported to your health care professional right away. Women who become pregnant while using this medicine may enroll in the [place] Antiepileptic Drug Pregnancy Registry by calling [phone]. This registry collects information about the safety of antiepileptic drug use during pregnancy. What side effects may I notice from receiving this medicine? Side effects that you should report to your doctor or health care professional as soon as possible: -allergic reactions like skin rash, itching or hives, swelling of the face, lips, or tongue -worsening of mood, thoughts or actions of suicide or dying Side effects that usually do not require medical attention (report to your doctor or health care professional if they continue or are bothersome): -constipation -difficulty walking or controlling muscle movements -dizziness -nausea -slurred speech -tiredness -tremors -weight gain This list may not describe all possible side effects. Call your doctor for medical advice about side effects. You may report side effects to FDA at 1-800-FDA-1088. Where should I keep my medicine? Keep out of reach of children. This medicine may cause accidental overdose and death if it taken by other adults, children, or pets. Mix any unused medicine with a substance like cat litter or coffee grounds. Then throw the medicine away in a sealed  container like a sealed bag or a coffee can with a lid. Do not use the medicine after the expiration date. Store at room temperature between 15 and 30 degrees C (59 and 86 degrees F). NOTE: This sheet is a summary. It may not cover all possible information. If you have questions about this medicine, talk to your doctor, pharmacist, or health care provider.  2018 [employer]vier/Gold Standard (2013-06-27 15:26:50)

## 2017-03-13 NOTE — Progress Notes (Signed)
Safety precautions to be maintained throughout the outpatient stay will include: orient to surroundings, keep bed in low position, maintain call bell within reach at all times, provide assistance with transfer out of bed and ambulation.  

## 2017-04-12 ENCOUNTER — Encounter: Payer: Self-pay | Admitting: Student in an Organized Health Care Education/Training Program

## 2017-04-12 ENCOUNTER — Ambulatory Visit
Payer: BLUE CROSS/BLUE SHIELD | Attending: Student in an Organized Health Care Education/Training Program | Admitting: Student in an Organized Health Care Education/Training Program

## 2017-04-12 ENCOUNTER — Other Ambulatory Visit: Payer: Self-pay

## 2017-04-12 DIAGNOSIS — G8929 Other chronic pain: Secondary | ICD-10-CM

## 2017-04-12 DIAGNOSIS — M7918 Myalgia, other site: Secondary | ICD-10-CM | POA: Diagnosis not present

## 2017-04-12 DIAGNOSIS — M25519 Pain in unspecified shoulder: Secondary | ICD-10-CM | POA: Diagnosis not present

## 2017-04-12 DIAGNOSIS — M5442 Lumbago with sciatica, left side: Secondary | ICD-10-CM | POA: Diagnosis not present

## 2017-04-12 DIAGNOSIS — K219 Gastro-esophageal reflux disease without esophagitis: Secondary | ICD-10-CM | POA: Diagnosis not present

## 2017-04-12 DIAGNOSIS — K5900 Constipation, unspecified: Secondary | ICD-10-CM | POA: Diagnosis not present

## 2017-04-12 DIAGNOSIS — J309 Allergic rhinitis, unspecified: Secondary | ICD-10-CM | POA: Diagnosis not present

## 2017-04-12 DIAGNOSIS — R200 Anesthesia of skin: Secondary | ICD-10-CM | POA: Insufficient documentation

## 2017-04-12 DIAGNOSIS — G894 Chronic pain syndrome: Secondary | ICD-10-CM | POA: Insufficient documentation

## 2017-04-12 DIAGNOSIS — Z79899 Other long term (current) drug therapy: Secondary | ICD-10-CM | POA: Diagnosis not present

## 2017-04-12 DIAGNOSIS — M542 Cervicalgia: Secondary | ICD-10-CM | POA: Diagnosis not present

## 2017-04-12 MED ORDER — TIZANIDINE HCL 4 MG PO TABS: 4.0000 mg | ORAL_TABLET | Freq: Two times a day (BID) | ORAL | 1 refills | Status: DC | PRN

## 2017-04-12 MED ORDER — DULOXETINE HCL 30 MG PO CPEP: 30.0000 mg | ORAL_CAPSULE | Freq: Every day | ORAL | 1 refills | Status: DC

## 2017-04-12 NOTE — Progress Notes (Signed)
Safety precautions to be maintained throughout the outpatient stay will include: orient to surroundings, keep bed in low position, maintain call bell within reach at all times, provide assistance with transfer out of bed and ambulation.  
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
## 2017-04-12 NOTE — Patient Instructions (Signed)
1. Stop Gabapentin 2. Start Cymbalta 30 mg daily 3. Rx for Tizanidine- muscle relaxant that you can take as needed

## 2017-05-10 ENCOUNTER — Encounter: Payer: Self-pay | Admitting: Student in an Organized Health Care Education/Training Program

## 2017-05-10 ENCOUNTER — Ambulatory Visit
Payer: BLUE CROSS/BLUE SHIELD | Attending: Student in an Organized Health Care Education/Training Program | Admitting: Student in an Organized Health Care Education/Training Program

## 2017-05-10 ENCOUNTER — Other Ambulatory Visit: Payer: Self-pay

## 2017-05-10 VITALS — BP 97/69 | HR 83 | Temp 98.1°F | Resp 16 | Ht 61.0 in | Wt 110.0 lb

## 2017-05-10 DIAGNOSIS — M7918 Myalgia, other site: Secondary | ICD-10-CM | POA: Diagnosis not present

## 2017-05-10 DIAGNOSIS — G8929 Other chronic pain: Secondary | ICD-10-CM | POA: Diagnosis not present

## 2017-05-10 DIAGNOSIS — Z79899 Other long term (current) drug therapy: Secondary | ICD-10-CM | POA: Diagnosis not present

## 2017-05-10 DIAGNOSIS — M5442 Lumbago with sciatica, left side: Secondary | ICD-10-CM | POA: Insufficient documentation

## 2017-05-10 DIAGNOSIS — N3090 Cystitis, unspecified without hematuria: Secondary | ICD-10-CM | POA: Insufficient documentation

## 2017-05-10 DIAGNOSIS — M545 Low back pain: Secondary | ICD-10-CM | POA: Diagnosis present

## 2017-05-10 DIAGNOSIS — G894 Chronic pain syndrome: Secondary | ICD-10-CM | POA: Insufficient documentation

## 2017-05-10 DIAGNOSIS — J309 Allergic rhinitis, unspecified: Secondary | ICD-10-CM | POA: Diagnosis not present

## 2017-05-10 DIAGNOSIS — R200 Anesthesia of skin: Secondary | ICD-10-CM | POA: Diagnosis not present

## 2017-05-10 DIAGNOSIS — M542 Cervicalgia: Secondary | ICD-10-CM | POA: Diagnosis not present

## 2017-05-10 DIAGNOSIS — K219 Gastro-esophageal reflux disease without esophagitis: Secondary | ICD-10-CM | POA: Diagnosis not present

## 2017-05-10 DIAGNOSIS — S134XXA Sprain of ligaments of cervical spine, initial encounter: Secondary | ICD-10-CM | POA: Diagnosis not present

## 2017-05-10 MED ORDER — TIZANIDINE HCL 4 MG PO TABS: 4.0000 mg | ORAL_TABLET | Freq: Two times a day (BID) | ORAL | 2 refills | Status: DC | PRN
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
## 2017-05-10 NOTE — Progress Notes (Signed)
Safety precautions to be maintained throughout the outpatient stay will include: orient to surroundings, keep bed in low position, maintain call bell within reach at all times, provide assistance with transfer out of bed and ambulation.  
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
## 2017-06-15 ENCOUNTER — Encounter: Payer: Self-pay | Admitting: *Deleted

## 2017-06-15 ENCOUNTER — Ambulatory Visit
Admission: EM | Admit: 2017-06-15 | Discharge: 2017-06-15 | Disposition: A | Discharge status: Home / Self Care | Payer: BLUE CROSS/BLUE SHIELD | Attending: Family Medicine | Admitting: Family Medicine

## 2017-06-15 DIAGNOSIS — R8290 Unspecified abnormal findings in urine: Secondary | ICD-10-CM

## 2017-06-15 DIAGNOSIS — R350 Frequency of micturition: Secondary | ICD-10-CM

## 2017-06-15 DIAGNOSIS — N3000 Acute cystitis without hematuria: Secondary | ICD-10-CM

## 2017-06-15 DIAGNOSIS — R300 Dysuria: Secondary | ICD-10-CM | POA: Diagnosis not present

## 2017-06-15 LAB — URINALYSIS, COMPLETE (UACMP) WITH MICROSCOPIC: Squamous Epithelial / LPF: NONE SEEN

## 2017-06-15 MED ORDER — NITROFURANTOIN MONOHYD MACRO 100 MG PO CAPS: 100.0000 mg | ORAL_CAPSULE | Freq: Two times a day (BID) | ORAL | 0 refills | Status: DC

## 2017-06-15 NOTE — ED Triage Notes (Signed)
Dysuria, urinary freq/urg x1 week.
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
## 2017-06-17 LAB — URINE CULTURE: Culture: >=100000 — AB

## 2017-08-23 ENCOUNTER — Ambulatory Visit
Admission: EM | Admit: 2017-08-23 | Discharge: 2017-08-23 | Disposition: A | Discharge status: Home / Self Care | Payer: BLUE CROSS/BLUE SHIELD | Attending: Family Medicine | Admitting: Family Medicine

## 2017-08-23 ENCOUNTER — Encounter: Payer: Self-pay | Admitting: *Deleted

## 2017-08-23 DIAGNOSIS — R109 Unspecified abdominal pain: Secondary | ICD-10-CM | POA: Diagnosis not present

## 2017-08-23 DIAGNOSIS — R350 Frequency of micturition: Secondary | ICD-10-CM | POA: Diagnosis not present

## 2017-08-23 DIAGNOSIS — N3090 Cystitis, unspecified without hematuria: Secondary | ICD-10-CM

## 2017-08-23 DIAGNOSIS — R300 Dysuria: Secondary | ICD-10-CM

## 2017-08-23 LAB — URINALYSIS, COMPLETE (UACMP) WITH MICROSCOPIC
RBC / HPF: NONE SEEN RBC/hpf (ref 0–5)
Squamous Epithelial / LPF: NONE SEEN

## 2017-08-23 MED ORDER — CEPHALEXIN 500 MG PO CAPS: 500.0000 mg | ORAL_CAPSULE | Freq: Two times a day (BID) | ORAL | 0 refills | Status: DC
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
## 2017-08-23 NOTE — Discharge Instructions (Signed)
Antibiotic as prescribed.  Urologists - Dr. Achilles Dunkope [facility]  Take care  Dr. Adriana Simasook

## 2017-08-23 NOTE — ED Triage Notes (Signed)
Recurrent dysuria, and urinary freq/urg. Recent UTI 4-6 weeks ago. Recurrent hx of UTIs.

## 2017-08-26 LAB — URINE CULTURE: Culture: 80000 — AB

## 2017-08-27 ENCOUNTER — Telehealth (HOSPITAL_COMMUNITY): Payer: Self-pay

## 2017-08-27 NOTE — Telephone Encounter (Signed)
Urine culture was positive for E. Coli and was given Keflex at urgent care visit. Attempted to contact patient to make aware, no answer and voicemail not available.

## 2017-10-08 ENCOUNTER — Other Ambulatory Visit: Payer: Self-pay

## 2017-10-08 ENCOUNTER — Ambulatory Visit
Admission: EM | Admit: 2017-10-08 | Discharge: 2017-10-08 | Disposition: A | Discharge status: Home / Self Care | Payer: BLUE CROSS/BLUE SHIELD | Attending: Family Medicine | Admitting: Family Medicine

## 2017-10-08 ENCOUNTER — Ambulatory Visit (INDEPENDENT_AMBULATORY_CARE_PROVIDER_SITE_OTHER): Payer: BLUE CROSS/BLUE SHIELD

## 2017-10-08 ENCOUNTER — Encounter: Payer: Self-pay | Admitting: Emergency Medicine

## 2017-10-08 DIAGNOSIS — S93402A Sprain of unspecified ligament of left ankle, initial encounter: Secondary | ICD-10-CM

## 2017-10-08 DIAGNOSIS — S99912A Unspecified injury of left ankle, initial encounter: Secondary | ICD-10-CM | POA: Diagnosis not present

## 2017-10-08 DIAGNOSIS — M25572 Pain in left ankle and joints of left foot: Secondary | ICD-10-CM | POA: Diagnosis not present

## 2017-10-08 DIAGNOSIS — M79672 Pain in left foot: Secondary | ICD-10-CM
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
## 2017-10-08 NOTE — ED Triage Notes (Signed)
Patient c/o sitting on her left ankle on Friday and she heard a "pop." C/o pain, swelling. Pain when she bears weight on left ankle.

## 2017-10-08 NOTE — Discharge Instructions (Signed)
Rest, ice, elevate, over the counter ibuprofen/tylenol

## 2017-10-09 IMAGING — CR DG ABDOMEN ACUTE W/ 1V CHEST
4 series · 4 of 4 positions shown · non-contrast
Comparison: None.

CLINICAL DATA: Flu like symptoms with cough and low-grade fevers.
Mid chest pain times 2-3 months. Left upper quadrant abdominal
tenderness.

EXAM:
DG ABDOMEN ACUTE W/ 1V CHEST

[chest pa]
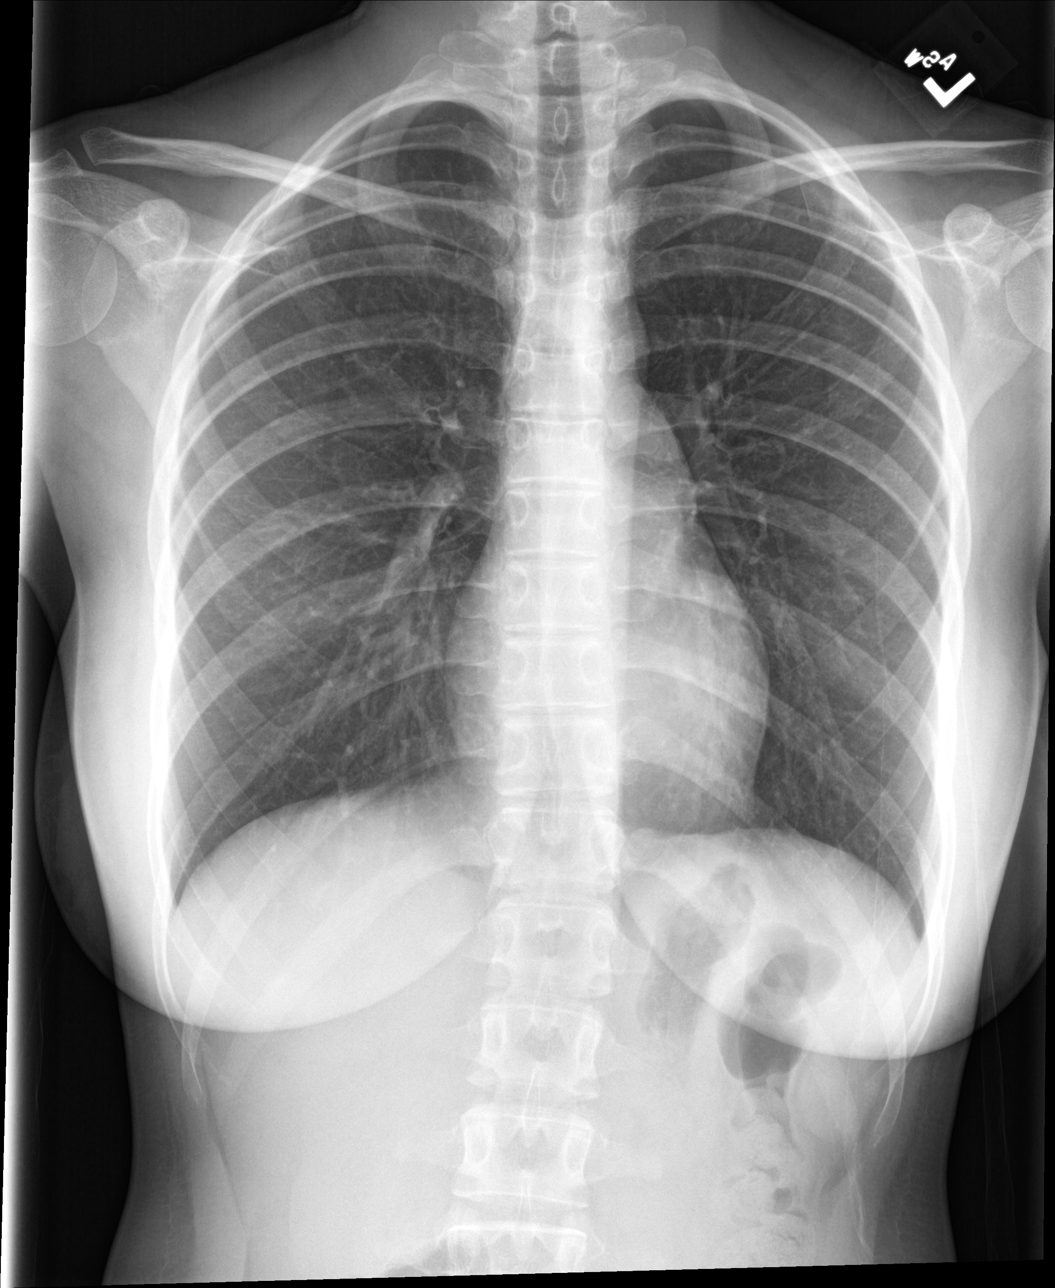

[abdomen erect]
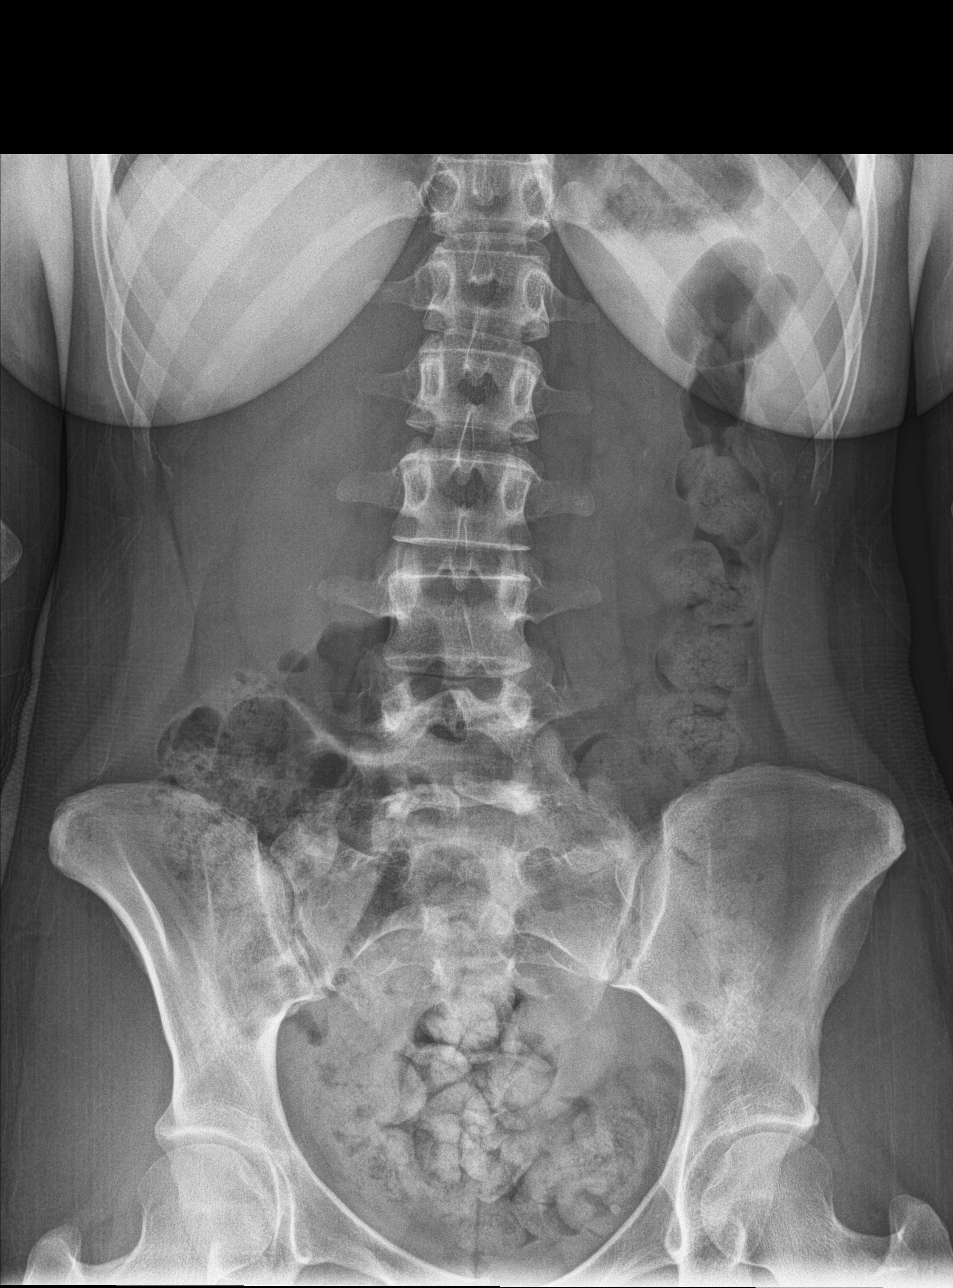

[abdomen supine (1 of 2)]
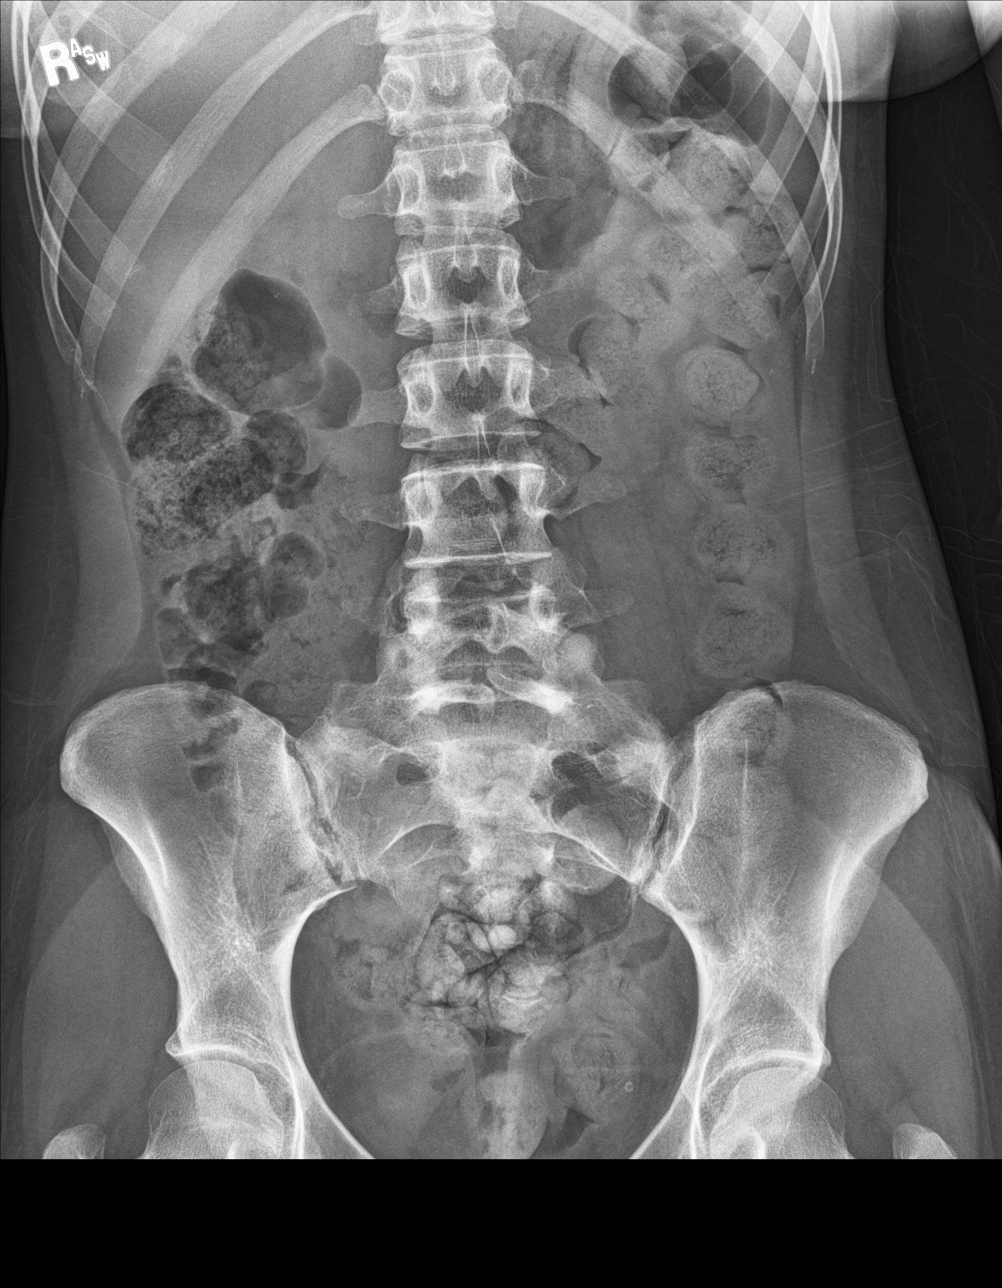

[abdomen supine (2 of 2)]
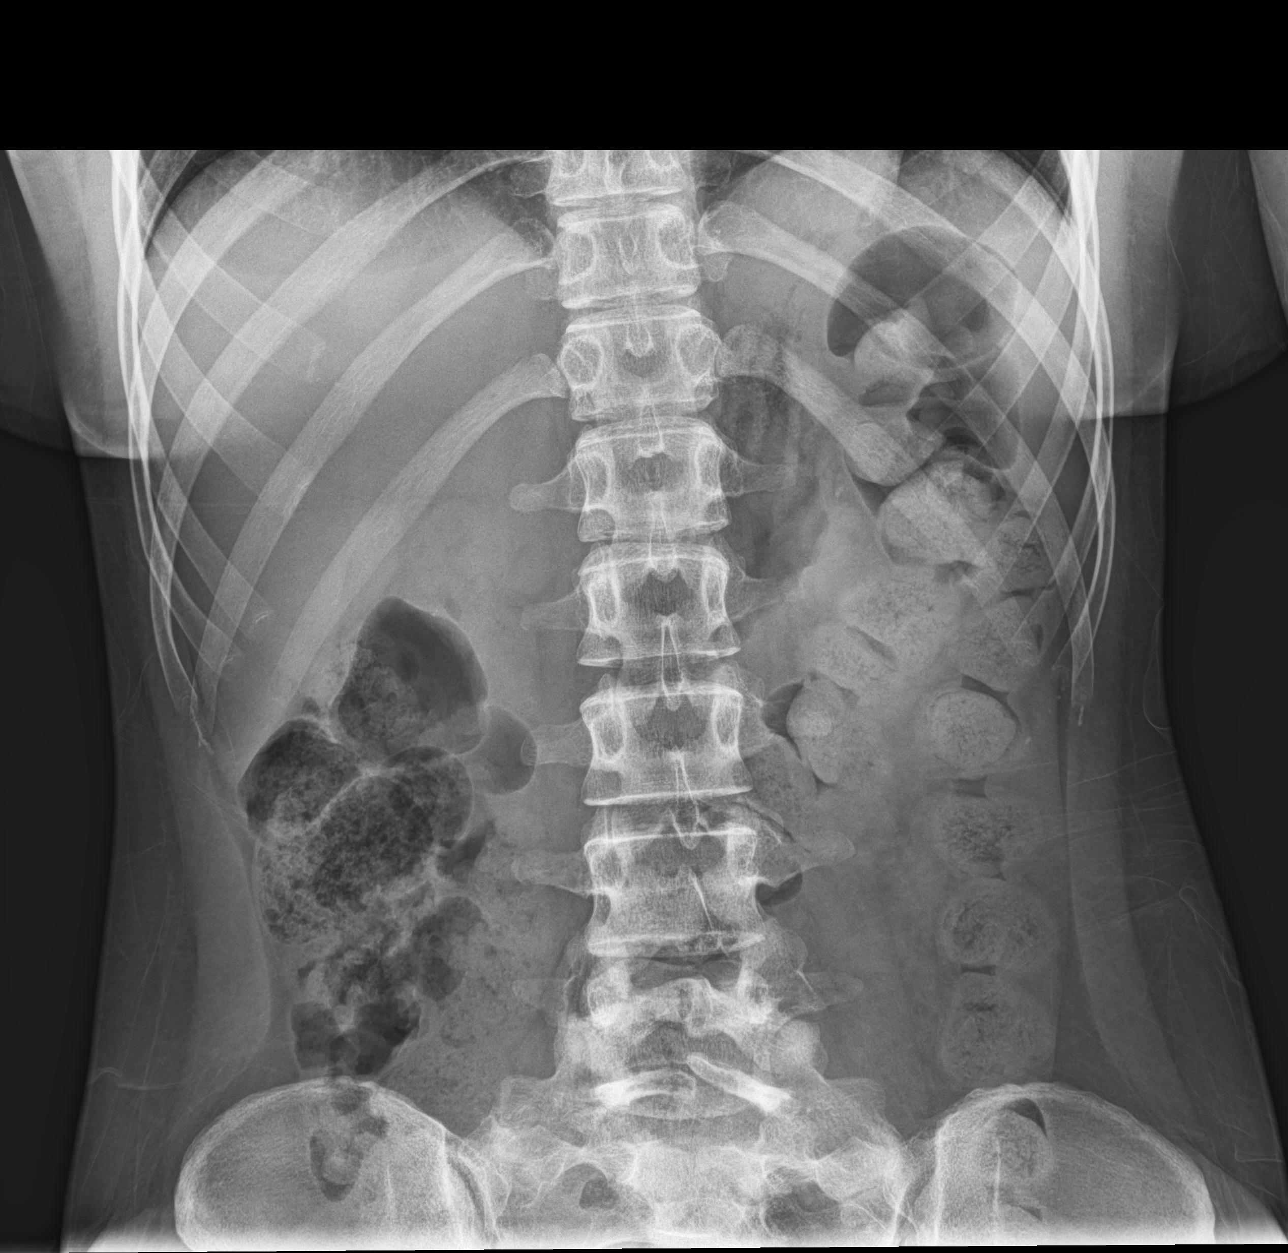

[4 of 4 positions shown; findings below may reference images not displayed]

FINDINGS: There is no evidence of dilated bowel loops or free intraperitoneal
air. There is increased stool throughout large bowel consistent with
constipation. No radiopaque calculi or other significant
radiographic abnormality is seen. Heart size and mediastinal
contours are within normal limits. Both lungs are clear. Lumbosacral
transitional vertebral anatomy. The lowest lumbar vertebra will be
labeled S1 and demonstrates spina bifida occulta.
IMPRESSION: Increased colonic stool burden throughout large bowel consistent
with constipation. No acute cardiopulmonary disease.

## 2017-10-23 ENCOUNTER — Ambulatory Visit
Admission: EM | Admit: 2017-10-23 | Discharge: 2017-10-23 | Disposition: A | Discharge status: Home / Self Care | Payer: BLUE CROSS/BLUE SHIELD | Attending: Family Medicine | Admitting: Family Medicine

## 2017-10-23 ENCOUNTER — Encounter: Payer: Self-pay | Admitting: Emergency Medicine

## 2017-10-23 ENCOUNTER — Other Ambulatory Visit: Payer: Self-pay

## 2017-10-23 DIAGNOSIS — R110 Nausea: Secondary | ICD-10-CM | POA: Diagnosis not present

## 2017-10-23 LAB — PREGNANCY, URINE: Preg Test, Ur: NEGATIVE

## 2017-10-23 MED ORDER — ONDANSETRON 4 MG PO TBDP: 4.0000 mg | ORAL_TABLET | Freq: Three times a day (TID) | ORAL | 0 refills | Status: DC | PRN

## 2017-10-23 NOTE — ED Triage Notes (Signed)
Patient in today requesting a urine pregnancy test. Patient states she is 1 week late for her period. Patient states she has had some nausea, increased appetite with a 5 lbs weight gain and increased sense of smell. Patient took home pregnancy test 2 weeks ago and it was negative.

## 2017-10-23 NOTE — Discharge Instructions (Signed)
Zofran if needed.  Follow up closely with your PCP.  Take care  Dr. Adriana Simasook
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
## 2017-11-26 ENCOUNTER — Ambulatory Visit: Payer: Self-pay | Admitting: Urology

## 2018-02-04 ENCOUNTER — Other Ambulatory Visit: Payer: Self-pay

## 2018-02-04 ENCOUNTER — Ambulatory Visit
Admission: EM | Admit: 2018-02-04 | Discharge: 2018-02-04 | Disposition: A | Discharge status: Home / Self Care | Payer: BLUE CROSS/BLUE SHIELD | Attending: Internal Medicine | Admitting: Internal Medicine

## 2018-02-04 DIAGNOSIS — M5442 Lumbago with sciatica, left side: Secondary | ICD-10-CM | POA: Diagnosis not present

## 2018-02-04 DIAGNOSIS — M5441 Lumbago with sciatica, right side: Secondary | ICD-10-CM | POA: Diagnosis not present

## 2018-02-04 DIAGNOSIS — M5440 Lumbago with sciatica, unspecified side: Secondary | ICD-10-CM

## 2018-02-04 MED ORDER — KETOROLAC TROMETHAMINE 60 MG/2ML IM SOLN
60.0000 mg | Freq: Once | INTRAMUSCULAR | Status: AC
Start: 2018-02-04 — End: 2018-02-04
  Administered 2018-02-04: 60 mg via INTRAMUSCULAR
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
## 2018-02-04 NOTE — Discharge Instructions (Signed)
No danger signs tonight.  Injection of ketorolac (anti inflammatory/pain reliever) given at urgent care for pain flare up in back.  Note for work for today.

## 2018-05-30 ENCOUNTER — Other Ambulatory Visit: Payer: Self-pay

## 2018-05-30 ENCOUNTER — Encounter: Payer: Self-pay | Admitting: Emergency Medicine

## 2018-05-30 ENCOUNTER — Ambulatory Visit
Admission: EM | Admit: 2018-05-30 | Discharge: 2018-05-30 | Disposition: A | Discharge status: Home / Self Care | Payer: BLUE CROSS/BLUE SHIELD | Attending: Family Medicine | Admitting: Family Medicine

## 2018-05-30 DIAGNOSIS — J069 Acute upper respiratory infection, unspecified: Secondary | ICD-10-CM | POA: Insufficient documentation

## 2018-05-30 DIAGNOSIS — B9789 Other viral agents as the cause of diseases classified elsewhere: Secondary | ICD-10-CM | POA: Diagnosis not present

## 2018-05-30 LAB — RAPID INFLUENZA A&B ANTIGENS
Influenza A (ARMC): NEGATIVE
Influenza B (ARMC): NEGATIVE

## 2018-05-30 LAB — RAPID STREP SCREEN (MED CTR MEBANE ONLY): Streptococcus, Group A Screen (Direct): NEGATIVE

## 2018-05-30 MED ORDER — BENZONATATE 100 MG PO CAPS: 100.0000 mg | ORAL_CAPSULE | Freq: Three times a day (TID) | ORAL | 0 refills | Status: DC | PRN

## 2018-05-30 MED ORDER — IPRATROPIUM BROMIDE 0.06 % NA SOLN: 2.0000 | Freq: Four times a day (QID) | NASAL | 0 refills | Status: DC | PRN
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
## 2018-05-30 NOTE — ED Triage Notes (Signed)
Pt c/o nasal congestion, cough, fatigue, nausea, vomiting, headache, and sore  Throat. She report that the sore throat started a couple of weeks ago but other sytmpoms started 4 days ago.

## 2018-06-02 LAB — CULTURE, GROUP A STREP (THRC)

## 2018-11-11 LAB — OB RESULTS CONSOLE VARICELLA ZOSTER ANTIBODY, IGG: Varicella: NON-IMMUNE/NOT IMMUNE

## 2018-11-11 LAB — OB RESULTS CONSOLE RUBELLA ANTIBODY, IGM: Rubella: IMMUNE

## 2018-11-11 LAB — OB RESULTS CONSOLE HEPATITIS B SURFACE ANTIGEN: Hepatitis B Surface Ag: NEGATIVE

## 2019-05-01 LAB — OB RESULTS CONSOLE GBS: GBS: NEGATIVE

## 2019-05-01 LAB — OB RESULTS CONSOLE GC/CHLAMYDIA
Chlamydia: NEGATIVE
Gonorrhea: NEGATIVE

## 2019-05-01 LAB — OB RESULTS CONSOLE RPR: RPR: NONREACTIVE

## 2019-05-01 LAB — OB RESULTS CONSOLE HIV ANTIBODY (ROUTINE TESTING): HIV: NONREACTIVE

## 2019-05-16 NOTE — L&D Delivery Note (Signed)
Delivery Note  Date of delivery: 05/25/2019 Estimated Date of Delivery: 05/30/19 Patient's last menstrual period was 08/23/2018 (approximate). EGA: [redacted]w[redacted]d  First Stage: Induction/Augmentation : misoprostol, AROM Analgesia Eliezer Lofts intrapartum: IV Stadol AROM at 1200  Kelly Ford presented to L&D for induction of labor due to EFW <10% at term. She was induced with misoprostol and then augmented with AROM. IV Stadol given for pain relief.   Second Stage: Complete dilation at 1348 Onset of pushing at 1350 FHR second stage: category II Delivery at 1359 on 05/25/2019  She progressed to complete and had a spontaneous vaginal birth of a live female over an intact perineum. The fetal head was delivered in direct OA position with restitution to ROA. One loop of loose nuchal cord, reduced on the perineum. Anterior then posterior shoulders delivered spontaneously. Baby placed on mom's abdomen and attended to by transition RN. Cord clamped and cut when pulseless by father of the baby Dalton.   Third Stage: Placenta delivered sponatenously intact with 3VC at 1404 Placenta disposition: pathology  Uterine tone firm / uterine bleeding scant, bleeding from laceration initially brisk IV pitocin given for hemorrhage prophylaxis  2nd degree laceration identified  Anesthesia for repair: lidocaien Repair: 3-0 Vicryl Rapide Est. Blood Loss (mL): 1000  Complications: none  Mom to postpartum.  Baby to Couplet care / Skin to Skin.  Newborn: Birth Weight: pending  Apgar Scores: 8, 9 Feeding planned: breast   Genia Del, CNM 05/25/2019 2:50 PM

## 2019-05-21 ENCOUNTER — Observation Stay
Admission: EM | Admit: 2019-05-21 | Discharge: 2019-05-21 | Disposition: A | Discharge status: Home / Self Care | Payer: BC Managed Care – PPO | Attending: Obstetrics and Gynecology | Admitting: Obstetrics and Gynecology

## 2019-05-21 ENCOUNTER — Other Ambulatory Visit: Payer: Self-pay

## 2019-05-21 DIAGNOSIS — K219 Gastro-esophageal reflux disease without esophagitis: Secondary | ICD-10-CM | POA: Diagnosis not present

## 2019-05-21 DIAGNOSIS — Z8349 Family history of other endocrine, nutritional and metabolic diseases: Secondary | ICD-10-CM | POA: Diagnosis not present

## 2019-05-21 DIAGNOSIS — Z3A38 38 weeks gestation of pregnancy: Secondary | ICD-10-CM | POA: Diagnosis not present

## 2019-05-21 DIAGNOSIS — Z79899 Other long term (current) drug therapy: Secondary | ICD-10-CM | POA: Diagnosis not present

## 2019-05-21 DIAGNOSIS — Z791 Long term (current) use of non-steroidal anti-inflammatories (NSAID): Secondary | ICD-10-CM | POA: Insufficient documentation

## 2019-05-21 DIAGNOSIS — Z820 Family history of epilepsy and other diseases of the nervous system: Secondary | ICD-10-CM | POA: Diagnosis not present

## 2019-05-21 DIAGNOSIS — O99613 Diseases of the digestive system complicating pregnancy, third trimester: Secondary | ICD-10-CM | POA: Diagnosis not present

## 2019-05-21 DIAGNOSIS — Z803 Family history of malignant neoplasm of breast: Secondary | ICD-10-CM | POA: Diagnosis not present

## 2019-05-21 DIAGNOSIS — O471 False labor at or after 37 completed weeks of gestation: Principal | ICD-10-CM | POA: Diagnosis present

## 2019-05-21 NOTE — OB Triage Note (Signed)
Pt. Presented to L/D triage for contractions/abd pain/pressure. She describes it as a constant pressure with intermittent stabbing in pelvis. The pressure is rated 7/10/ No LOF or bleeding, positive fetal movement. VSS. Patient stable.
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
## 2019-05-23 ENCOUNTER — Other Ambulatory Visit: Payer: Self-pay | Admitting: Obstetrics and Gynecology

## 2019-05-23 NOTE — Progress Notes (Signed)
-  E. Coli UTI 1st trimester; Rx Macrobid, then 6/29 Bactrim  -Varicella nonimmune -Anemia; Hgb 10.1 at 28w 03/07/2019, advised iron supplement daily, pt is taking per 12/2 note  05/20/2018: False labor at [redacted]w[redacted]d, pt stable and discharged  05/14/2019-->size less than dates US OB Today: Efw=6lb3oz (2803g)= <10% Afi=16.27cm @ 66% Fhr=149bpm Placenta=anterior Position=vertex    MBT: A positive  Ab screen: Neg  Pap: NILM  HIV: Neg  Hep B/RPR: Neg/NR  G/C: neg/neg  Rubella:  immune  VZV: Non Immune  Breast OCPS  Baby boy "Hudson"

## 2019-05-24 ENCOUNTER — Other Ambulatory Visit: Payer: Self-pay | Admitting: Certified Nurse Midwife

## 2019-05-25 ENCOUNTER — Inpatient Hospital Stay
Admission: EM | Admit: 2019-05-25 | Discharge: 2019-05-27 | DRG: 806 | Disposition: A | Discharge status: Home / Self Care | Payer: BC Managed Care – PPO | Attending: Obstetrics and Gynecology | Admitting: Obstetrics and Gynecology

## 2019-05-25 ENCOUNTER — Other Ambulatory Visit: Payer: Self-pay

## 2019-05-25 ENCOUNTER — Encounter: Payer: Self-pay | Admitting: Obstetrics and Gynecology

## 2019-05-25 DIAGNOSIS — Z20822 Contact with and (suspected) exposure to covid-19: Secondary | ICD-10-CM | POA: Diagnosis present

## 2019-05-25 DIAGNOSIS — Z3A39 39 weeks gestation of pregnancy: Secondary | ICD-10-CM

## 2019-05-25 DIAGNOSIS — O9081 Anemia of the puerperium: Secondary | ICD-10-CM | POA: Diagnosis not present

## 2019-05-25 DIAGNOSIS — D62 Acute posthemorrhagic anemia: Secondary | ICD-10-CM | POA: Diagnosis not present

## 2019-05-25 DIAGNOSIS — O365990 Maternal care for other known or suspected poor fetal growth, unspecified trimester, not applicable or unspecified: Secondary | ICD-10-CM | POA: Diagnosis present

## 2019-05-25 DIAGNOSIS — O26893 Other specified pregnancy related conditions, third trimester: Secondary | ICD-10-CM | POA: Diagnosis present

## 2019-05-25 LAB — CBC
HCT: 31.2 % — ABNORMAL LOW (ref 36.0–46.0)
Hemoglobin: 9.8 g/dL — ABNORMAL LOW (ref 12.0–15.0)
MCH: 25 pg — ABNORMAL LOW (ref 26.0–34.0)
MCHC: 31.4 g/dL (ref 30.0–36.0)
MCV: 79.6 fL — ABNORMAL LOW (ref 80.0–100.0)
Platelets: 240 10*3/uL (ref 150–400)
RBC: 3.92 MIL/uL (ref 3.87–5.11)
RDW: 15.5 % (ref 11.5–15.5)
WBC: 7.6 10*3/uL (ref 4.0–10.5)
nRBC: 0 % (ref 0.0–0.2)

## 2019-05-25 LAB — TYPE AND SCREEN
ABO/RH(D): A POS
Antibody Screen: NEGATIVE

## 2019-05-25 LAB — RPR: RPR Ser Ql: NONREACTIVE

## 2019-05-25 LAB — ABO/RH: ABO/RH(D): A POS

## 2019-05-25 LAB — RESPIRATORY PANEL BY RT PCR (FLU A&B, COVID)
Influenza A by PCR: NEGATIVE
Influenza B by PCR: NEGATIVE
SARS Coronavirus 2 by RT PCR: NEGATIVE

## 2019-05-25 MED ORDER — OXYTOCIN BOLUS FROM INFUSION
500.0000 mL | Freq: Once | INTRAVENOUS | Status: AC
  Administered 2019-05-25: 500 mL via INTRAVENOUS

## 2019-05-25 MED ORDER — DIBUCAINE (PERIANAL) 1 % EX OINT
1.0000 "application " | TOPICAL_OINTMENT | CUTANEOUS | Status: DC | PRN
  Administered 2019-05-25: 1 via RECTAL
  Filled 2019-05-25: qty 28

## 2019-05-25 MED ORDER — TERBUTALINE SULFATE 1 MG/ML IJ SOLN: 0.2500 mg | Freq: Once | INTRAMUSCULAR | Status: DC | PRN

## 2019-05-25 MED ORDER — FERROUS SULFATE 325 (65 FE) MG PO TABS
325.0000 mg | ORAL_TABLET | Freq: Two times a day (BID) | ORAL | Status: DC
  Administered 2019-05-25 – 2019-05-27 (×4): 325 mg via ORAL
  Filled 2019-05-25 (×4): qty 1

## 2019-05-25 MED ORDER — LIDOCAINE HCL (PF) 1 % IJ SOLN
30.0000 mL | INTRAMUSCULAR | Status: AC | PRN
  Administered 2019-05-25: 30 mL via SUBCUTANEOUS

## 2019-05-25 MED ORDER — PRENATAL MULTIVITAMIN CH
1.0000 | ORAL_TABLET | Freq: Every day | ORAL | Status: DC
  Administered 2019-05-26 – 2019-05-27 (×2): 1 via ORAL
  Filled 2019-05-25 (×2): qty 1

## 2019-05-25 MED ORDER — LIDOCAINE HCL (PF) 1 % IJ SOLN
INTRAMUSCULAR | Status: AC
  Filled 2019-05-25: qty 30

## 2019-05-25 MED ORDER — ACETAMINOPHEN 325 MG PO TABS: 650.0000 mg | ORAL_TABLET | ORAL | Status: DC | PRN

## 2019-05-25 MED ORDER — LACTATED RINGERS IV SOLN: 500.0000 mL | INTRAVENOUS | Status: DC | PRN

## 2019-05-25 MED ORDER — LACTATED RINGERS IV SOLN: INTRAVENOUS | Status: DC

## 2019-05-25 MED ORDER — OXYTOCIN 40 UNITS IN NORMAL SALINE INFUSION - SIMPLE MED
2.5000 [IU]/h | INTRAVENOUS | Status: DC
  Administered 2019-05-25: 2.5 [IU]/h via INTRAVENOUS

## 2019-05-25 MED ORDER — WITCH HAZEL-GLYCERIN EX PADS
1.0000 "application " | MEDICATED_PAD | CUTANEOUS | Status: DC
  Administered 2019-05-25 – 2019-05-27 (×2): 1 via TOPICAL
  Filled 2019-05-25 (×2): qty 100

## 2019-05-25 MED ORDER — IBUPROFEN 600 MG PO TABS
600.0000 mg | ORAL_TABLET | Freq: Four times a day (QID) | ORAL | Status: DC
  Administered 2019-05-25 – 2019-05-27 (×8): 600 mg via ORAL
  Filled 2019-05-25 (×8): qty 1

## 2019-05-25 MED ORDER — BUTORPHANOL TARTRATE 1 MG/ML IJ SOLN
1.0000 mg | INTRAMUSCULAR | Status: DC | PRN
  Administered 2019-05-25 (×2): 1 mg via INTRAVENOUS
  Filled 2019-05-25 (×2): qty 1

## 2019-05-25 MED ORDER — DIPHENHYDRAMINE HCL 25 MG PO CAPS: 25.0000 mg | ORAL_CAPSULE | Freq: Four times a day (QID) | ORAL | Status: DC | PRN

## 2019-05-25 MED ORDER — AMMONIA AROMATIC IN INHA
RESPIRATORY_TRACT | Status: AC
  Filled 2019-05-25: qty 10

## 2019-05-25 MED ORDER — COCONUT OIL OIL
1.0000 "application " | TOPICAL_OIL | Status: DC | PRN
  Administered 2019-05-26: 1 via TOPICAL
  Filled 2019-05-25: qty 120

## 2019-05-25 MED ORDER — VARICELLA VIRUS VACCINE LIVE 1350 PFU/0.5ML IJ SUSR
0.5000 mL | Freq: Once | INTRAMUSCULAR | Status: AC
  Administered 2019-05-27: 0.5 mL via SUBCUTANEOUS
  Filled 2019-05-25 (×2): qty 0.5

## 2019-05-25 MED ORDER — ONDANSETRON HCL 4 MG/2ML IJ SOLN: 4.0000 mg | INTRAMUSCULAR | Status: DC | PRN

## 2019-05-25 MED ORDER — MISOPROSTOL 25 MCG QUARTER TABLET
25.0000 ug | ORAL_TABLET | ORAL | Status: DC | PRN
  Administered 2019-05-25 (×2): 25 ug via VAGINAL
  Filled 2019-05-25 (×2): qty 1

## 2019-05-25 MED ORDER — ONDANSETRON HCL 4 MG/2ML IJ SOLN
4.0000 mg | Freq: Four times a day (QID) | INTRAMUSCULAR | Status: DC | PRN
  Administered 2019-05-25: 4 mg via INTRAVENOUS
  Filled 2019-05-25: qty 2

## 2019-05-25 MED ORDER — ACETAMINOPHEN 500 MG PO TABS
1000.0000 mg | ORAL_TABLET | Freq: Four times a day (QID) | ORAL | Status: DC | PRN
  Administered 2019-05-26: 1000 mg via ORAL
  Filled 2019-05-25: qty 2

## 2019-05-25 MED ORDER — BENZOCAINE-MENTHOL 20-0.5 % EX AERO
1.0000 "application " | INHALATION_SPRAY | CUTANEOUS | Status: DC | PRN
  Administered 2019-05-25 – 2019-05-27 (×3): 1 via TOPICAL
  Filled 2019-05-25 (×3): qty 56

## 2019-05-25 MED ORDER — SOD CITRATE-CITRIC ACID 500-334 MG/5ML PO SOLN: 30.0000 mL | ORAL | Status: DC | PRN

## 2019-05-25 MED ORDER — SIMETHICONE 80 MG PO CHEW
160.0000 mg | CHEWABLE_TABLET | ORAL | Status: DC | PRN
  Administered 2019-05-26: 160 mg via ORAL
  Filled 2019-05-25: qty 2

## 2019-05-25 MED ORDER — OXYTOCIN 10 UNIT/ML IJ SOLN
INTRAMUSCULAR | Status: AC
  Filled 2019-05-25: qty 2

## 2019-05-25 MED ORDER — SENNOSIDES-DOCUSATE SODIUM 8.6-50 MG PO TABS
2.0000 | ORAL_TABLET | ORAL | Status: DC
  Administered 2019-05-26 – 2019-05-27 (×3): 2 via ORAL
  Filled 2019-05-25 (×3): qty 2

## 2019-05-25 MED ORDER — OXYTOCIN 40 UNITS IN NORMAL SALINE INFUSION - SIMPLE MED
INTRAVENOUS | Status: AC
  Filled 2019-05-25: qty 1000

## 2019-05-25 MED ORDER — OXYTOCIN 40 UNITS IN NORMAL SALINE INFUSION - SIMPLE MED: 1.0000 m[IU]/min | INTRAVENOUS | Status: DC

## 2019-05-25 MED ORDER — ASCORBIC ACID 500 MG PO TABS
500.0000 mg | ORAL_TABLET | Freq: Two times a day (BID) | ORAL | Status: DC
  Administered 2019-05-25 – 2019-05-27 (×4): 500 mg via ORAL
  Filled 2019-05-25 (×5): qty 1

## 2019-05-25 MED ORDER — ONDANSETRON HCL 4 MG PO TABS
4.0000 mg | ORAL_TABLET | ORAL | Status: DC | PRN
  Administered 2019-05-26: 4 mg via ORAL
  Filled 2019-05-25: qty 1

## 2019-05-25 MED ORDER — MISOPROSTOL 200 MCG PO TABS
ORAL_TABLET | ORAL | Status: AC
  Filled 2019-05-25: qty 4

## 2019-05-25 NOTE — Discharge Summary (Signed)
Obstetric Discharge Summary   Patient Name: Kelly Ford DOB: 07-27-1995 MRN: [id number]  Date of Admission: 05/25/2019 Date of Delivery: 05/25/2019 Delivered by: Lisette Grinder, CNM Date of Discharge: 05/27/2019  Primary OB: [facility]  [id number] last menstrual period was 08/23/2018 (approximate). EDC Estimated Date of Delivery: 05/30/19 Gestational Age at Delivery: [redacted]w[redacted]d   Antepartum complications:  1. E Coli UTI first trimester,  2. Varicella non-immune 3. Anemia on iron supplementation 4. EFW <10% at 39w  Admitting Diagnosis: planned IOL  Secondary Diagnoses: Patient Active Problem List   Diagnosis Date Noted  . NSVD (normal spontaneous vaginal delivery) 05/26/2019  . Acute blood loss anemia 05/26/2019  . False labor after 37 weeks of gestation without delivery 05/21/2019  . Anovulatory cycle 07/01/2015  . Constipation 07/01/2015  . Cystitis 06/22/2015  . Allergic rhinitis 06/21/2015  . Acid reflux 06/21/2015    Induction/Augmentation: AROM and Cytotec Complications: None Intrapartum complications/course: She presented to L&D for induction of labor due to EFW <10% at term. She was induced with misoprostol and then augmented with AROM. IV Stadol given for pain relief. She progressed to complete and had a spontaneous vaginal birth of a live female over an intact perineum. The fetal head was delivered in direct OA position with restitution to ROA. One loop of loose nuchal cord, reduced on the perineum. Anterior then posterior shoulders delivered spontaneously. Baby placed on mom's abdomen and attended to by transition RN. Cord clamped and cut when pulseless by father of the baby Dalton. Placenta delivered sponatenously intact with 3VC. Uterine tone firm / uterine bleeding scant, bleeding from laceration initially brisk. IV pitocin given for hemorrhage prophylaxis. 2nd degree laceration identified and repaired in the usual fashion. Pressure held on area  of repair, as there was some welling in that area. [place]elling resolved with pressure.   Delivery Type: spontaneous vaginal delivery Anesthesia: IV Stadol Placenta: spontaneous Laceration: 2nd degree perineal Episiotomy: none  Newborn Data: Live born female "Hudson" Birth Weight:  5lb14.9 (2690) APGAR: 8, 9  Newborn Delivery   Birth date/time: 05/25/2019 13:59:00 Delivery type: Vaginal, Spontaneous        Postpartum Course  Patient had an uncomplicated postpartum course.  By time of discharge on PPD#2, her pain was controlled on oral pain medications; she had appropriate lochia and was ambulating, voiding without difficulty and tolerating regular diet.  She was deemed stable for discharge to home.       Labs: CBC Latest Ref Rng & Units 05/26/2019 05/25/2019 05/18/2016  WBC 4.0 - 10.5 K/uL 12.5(H) 7.6 7.0  Hemoglobin 12.0 - 15.0 g/dL 9.1(L) 9.8(L) 14.6  Hematocrit 36.0 - 46.0 % 29.2(L) 31.2(L) 44.1  Platelets 150 - 400 K/uL 225 240 228   A POS Performed at [facility], [address] [place], [place] [address]   Physical exam:  BP (!) 96/58 (BP Location: Left Arm)   Pulse 83   Temp (!) 97.5 F (36.4 C) (Oral)   Resp 18   Ht 5\' 1"  (1.549 m)   Wt 61.7 kg   LMP 08/23/2018 (Approximate)   SpO2 98%   Breastfeeding Unknown   BMI 25.70 kg/m  General: alert and no distress Pulm: normal respiratory effort Lochia: appropriate Abdomen: soft, NT Uterine Fundus: firm, below umbilicus Extremities: No evidence of DVT seen on physical exam. No lower extremity edema.   Disposition: stable, discharge to home Baby Feeding: breastmilk Baby Disposition: home with mom  Contraception: May consider IUD but want to discuss with husband first  Prenatal Labs:  Blood type/Rh A+  Antibody screen neg  Rubella Immune  Varicella Non-immune  RPR NR  HBsAg Neg  HIV NR  GC neg  Chlamydia neg  Genetic screening declined  1 hour GTT 103  3 hour GTT n/a  GBS negative   Rh  Immune globulin given: n/a Rubella vaccine given: n/a Varicella vaccine given: ordered to be given PP Tdap vaccine given in AP or PP setting: 03/07/2019 Flu vaccine given in AP or PP setting: 03/07/2019  Plan:  Ian Bushman was discharged to home in good condition. Follow-up appointment at [facility] with delivery provider in 6 weeks  Discharge Instructions: Per After Visit Summary. Activity: Advance as tolerated. Pelvic rest for 6 weeks.   Diet: Regular Discharge Medications: Allergies as of 05/27/2019   No Known Allergies     Medication List    STOP taking these medications   ondansetron 8 MG tablet Commonly known as: ZOFRAN     TAKE these medications   acetaminophen 325 MG tablet Commonly known as: TYLENOL Take 650 mg by mouth every 6 (six) hours as needed.   ascorbic acid 1000 MG tablet Commonly known as: VITAMIN C Take by mouth.   benzonatate 100 MG capsule Commonly known as: TESSALON Take 1 capsule (100 mg total) by mouth 3 (three) times daily as needed.   Biotin 10 MG Caps Take by mouth.   diphenhydrAMINE 25 mg capsule Commonly known as: BENADRYL Take 25 mg by mouth every 6 (six) hours as needed. Reported on 07/01/2015   ferrous sulfate 325 (65 FE) MG tablet Take 325 mg by mouth daily with breakfast.   GLUCOSAMINE SULFATE PO Take by mouth.   ibuprofen 200 MG tablet Commonly known as: ADVIL Take 200 mg by mouth every 8 (eight) hours as needed. 1-2 tabs every 8 hours as needed   ipratropium 0.06 % nasal spray Commonly known as: ATROVENT Place 2 sprays into both nostrils 4 (four) times daily as needed for rhinitis.   Lysine HCl 1000 MG Tabs Take 2,000 mg by mouth.   multivitamin capsule Take 1 capsule by mouth daily.   pyridOXINE 100 MG tablet Commonly known as: VITAMIN B-6 Take 100 mg by mouth daily.   Vitamin D3 10 MCG (400 UNIT) Caps Take 1 capsule by mouth daily.   Vitamin E 100 units Tabs Take by mouth.   ZyrTEC  Allergy 10 MG Caps Generic drug: Cetirizine HCl Take by mouth. Reported on 07/01/2015      Outpatient follow up:  Follow-up Information    Genia Del, CNM. Schedule an appointment as soon as possible for a visit in 6 week(s).   Specialty: Certified Nurse Midwife Why: For routine postpartum visit Contact information: [address] [place] [address] [phone]            Signed: Cyril Mourning CNM 05/27/19 8:29 AM

## 2019-05-25 NOTE — Progress Notes (Signed)
Labor Progress Note  Kelly Ford is a 24 y.o. G1P0 at [redacted]w[redacted]d by LMP admitted for induction of labor due to EFW <10% at term.  Subjective:  Feeling pressure in her bottom  Objective: BP 99/71 (BP Location: Left Arm)   Pulse 69   Temp 97.6 F (36.4 C) (Oral)   Resp 16   Ht 5\' 1"  (1.549 m)   Wt 61.7 kg   LMP 08/23/2018 (Approximate)   BMI 25.70 kg/m   Fetal Assessment: FHT:  FHR: 125 bpm, variability: moderate,  accelerations:  Present,  decelerations:  Present variable Category/reactivity:  Category II UC:   regular, every 1.5-3 minutes SVE:    Dilation: 6-7cm  Effacement: 80%  Station:  0  Consistency: soft  Position: middle  Membrane status: AROM 1200 Amniotic color: clear, blood-tinged  Labs: Lab Results  Component Value Date   WBC 7.6 05/25/2019   HGB 9.8 (L) 05/25/2019   HCT 31.2 (L) 05/25/2019   MCV 79.6 (L) 05/25/2019   PLT 240 05/25/2019    Assessment / Plan: Induction of labor due to EFW <10%  Labor: S/p misoprostol x 2 doses; s/p AROM Fetal Wellbeing:  Category II, overall reassuring Pain Control:  IV pain meds I/D:  n/a Anticipated MOD:  NSVD  07/23/2019, CNM 05/25/2019, 1:21 PM

## 2019-05-25 NOTE — Progress Notes (Signed)
Labor Progress Note  Kelly Ford is a 24 y.o. G1P0 at [redacted]w[redacted]d by LMP admitted for induction of labor due to EFW <10% at term.  Subjective:  Felt some relief with Stadol.   Objective: BP 99/71 (BP Location: Left Arm)   Pulse 69   Temp 97.6 F (36.4 C) (Oral)   Resp 16   Ht 5\' 1"  (1.549 m)   Wt 61.7 kg   LMP 08/23/2018 (Approximate)   BMI 25.70 kg/m   Fetal Assessment: FHT:  FHR: 125 bpm, variability: moderate,  accelerations:  Present,  decelerations:  Absent Category/reactivity:  Category I UC:   regular, every 1.5-3 minutes SVE:    Dilation: 5cm  Effacement: 70-80%  Station:  -1  Consistency: soft  Position: middle  Membrane status: AROM now Amniotic color: clear, blood-tinged  Labs: Lab Results  Component Value Date   WBC 7.6 05/25/2019   HGB 9.8 (L) 05/25/2019   HCT 31.2 (L) 05/25/2019   MCV 79.6 (L) 05/25/2019   PLT 240 05/25/2019    Assessment / Plan: Induction of labor due to EFW <10%  Labor: S/p misoprostol x 2 doses; AROM now Fetal Wellbeing:  Category I Pain Control:  IV pain meds I/D:  n/a Anticipated MOD:  NSVD  07/23/2019, CNM 05/25/2019, 11:50 AM
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
## 2019-05-25 NOTE — Progress Notes (Signed)
   05/25/19 1700  Clinical Encounter Type  Visited With Patient and family together  Visit Type Initial  Referral From Nurse  Consult/Referral To Chaplain  Spiritual Encounters  Spiritual Needs Emotional;Other (Comment)  [facility] Waters and this Chartered loss adjuster assisted pt and family with AD education. CH informed pt that document can be completed in community and brought to hospital. This Chartered loss adjuster and [facility] Waters built rapport through [employer] talk. Pastoral visit was appreciated. No further needs expressed at this time.

## 2019-05-26 DIAGNOSIS — D62 Acute posthemorrhagic anemia: Secondary | ICD-10-CM | POA: Diagnosis not present

## 2019-05-26 LAB — CBC
HCT: 29.2 % — ABNORMAL LOW (ref 36.0–46.0)
Hemoglobin: 9.1 g/dL — ABNORMAL LOW (ref 12.0–15.0)
MCH: 25.4 pg — ABNORMAL LOW (ref 26.0–34.0)
MCHC: 31.2 g/dL (ref 30.0–36.0)
MCV: 81.6 fL (ref 80.0–100.0)
Platelets: 225 10*3/uL (ref 150–400)
RBC: 3.58 MIL/uL — ABNORMAL LOW (ref 3.87–5.11)
RDW: 15.3 % (ref 11.5–15.5)
WBC: 12.5 10*3/uL — ABNORMAL HIGH (ref 4.0–10.5)
nRBC: 0 % (ref 0.0–0.2)

## 2019-05-26 NOTE — Discharge Instructions (Signed)
Please call your doctor or return to the ER if you experience any chest pains, shortness of breath, dizziness, visual changes, severe headache (unrelieved by pain meds), fever greater than 101, any heavy bleeding (saturating more than 1 pad per hour), large clots, or foul smelling discharge, any worsening abdominal pain and cramping that is not controlled by pain medication, any calf/leg pain or redness, any breast concerns (redness/pain), or any signs of postpartum depression. No tampons, enemas, douches, or sexual intercourse for 6 weeks. Also avoid tub baths, hot tubs, or swimming for 6 weeks.        After Your Delivery Discharge Instructions   Postpartum: Care Instructions  After childbirth (postpartum period), your body goes through many changes. Some of these changes happen over several weeks. In the hours after delivery, your body will begin to recover from childbirth while it prepares to breastfeed your newborn. You may feel emotional during this time. Your hormones can shift your mood without warning for no clear reason.  In the first couple of weeks after childbirth, many women have emotions that change from happy to sad. You may find it hard to sleep. You may cry a lot. This is called the "baby blues." These overwhelming emotions often go away within a couple of days or weeks. But it's important to discuss your feelings with your doctor.  You should call your care provider if you have unrelieved feelings of:  Inability to cope  Sadness  Anxiety  Lack of interest in baby  Insomnia  Crying  It is easy to get too tired and overwhelmed during the first weeks after childbirth. Don't try to do too much. Get rest whenever you can, accept help from others, and eat well and drink plenty of fluids.  About 4 to 6 weeks after your baby's birth, you will have a follow-up visit with your care provider. This visit is your time to talk to your provider about anything you are concerned or  curious about.  Follow-up care is a key part of your treatment and safety. Be sure to make and go to all appointments, and call your doctor if you are having problems. It's also a good idea to know your test results and keep a list of the medicines you take.  How can you care for yourself at home?  Sleep or rest when your baby sleeps.  Get help with household chores from family or friends, if you can. Do not try to do it all yourself.  If you have hemorrhoids or swelling or pain around the opening of your vagina, try using cold and heat. You can put ice or a cold pack on the area for 10 to 20 minutes at a time. Put a thin cloth between the ice and your skin. Also try sitting in a few inches of warm water (sitz bath) 3 times a day and after bowel movements.  Take pain medicines exactly as directed.  If the provider gave you a prescription medicine for pain, take it as prescribed.  If you do not have a prescription and need something over the counter, you can take:  Ibuprofen (Motrin, Advil) up to 600mg  every 6 hours as needed for pain  Acetaminophen (Tylenol) up to 1000mg  every 6 hours as needed for pain  Some people find it helpful to alternate between these two medications.   No driving for 1-2 weeks or while taking pain medications.   Eat more fiber to avoid constipation. Include foods such as whole-grain breads  and cereals, raw vegetables, raw and dried fruits, and beans.  Drink plenty of fluids, enough so that your urine is light yellow or clear like water. If you have kidney, heart, or liver disease and have to limit fluids, talk with your doctor before you increase the amount of fluids you drink.  Do not put anything in the vagina for 6 weeks. This means no sex, no tampons, no douching, and no enemas.  If you have stitches, keep the area clean by pouring or spraying warm water over the area outside your vagina and anus after you use the toilet.  No strenuous activity or heavy  lifting for 6 weeks   No tub baths; showers only  Continue prenatal vitamin and iron.  If breastfeeding:  Increase calories and fluids while breastfeeding.  You may have a slight fever when your milk comes in, but it should go away on its own. If it does not, and rises above 101.0 please call the doctor.  For breastfeeding concerns, the lactation consultant can be reached at [phone].  For concerns about your baby, please call your pediatrician.   Keep a list of questions to bring to your postpartum visit. Your questions might be about:  Changes in your breasts, such as lumps or soreness.  When to expect your menstrual period to start again.  What form of birth control is best for you.  Weight you have put on during the pregnancy.  Exercise options.  What foods and drinks are best for you, especially if you are breastfeeding.  Problems you might be having with breastfeeding.  When you can have sex. Some women may want to talk about lubricants for the vagina.  Any feelings of sadness or restlessness that you are having.   When should you call for help?  Call 911 anytime you think you may need emergency care. For example, call if:  You have thoughts of harming yourself, your baby, or another person.  You passed out (lost consciousness).  Call the office at [phone] or seek immediate medical care if:  If you have heavy bleeding such that you are soaking 1 pad in an hour for 2 hours  You are dizzy or lightheaded, or you feel like you may faint.  You have a fever; a temperature of 101.0 F or greater  Chills  Difficulty urinating  Headache unrelieved by "pain meds"   Visual changes  Pain in the right side of your belly near your ribs  Breasts reddened, hard, hot to the touch or any other breast concerns  Nipple discharge which is foul-smelling or contains pus   Increased pain at the site of the tear   New pain unrelieved with recommended  over-the-counter dosages  Difficulty breathing with or without chest pain   New leg pain, swelling, or redness, especially if it is only on one leg  Any other concerns  Watch closely for changes in your health, and be sure to contact your provider if:  You have new or worse vaginal discharge.  You feel sad or depressed.  You are having problems with your breasts or breastfeeding.    

## 2019-05-26 NOTE — Progress Notes (Signed)
Post Partum Day 1  Subjective: no complaints, up ad lib, voiding, tolerating PO and + flatus  Doing well, no concerns. Ambulating without difficulty, pain managed with PO meds, tolerating regular diet, and voiding without difficulty.   No fever/chills, chest pain, shortness of breath, nausea/vomiting, or leg pain. No nipple or breast pain.   Objective: BP 94/62 (BP Location: Left Arm)   Pulse 74   Temp 98.9 F (37.2 C) (Oral)   Resp 18   Ht 5\' 1"  (1.549 m)   Wt 61.7 kg   LMP 08/23/2018 (Approximate)   SpO2 98%   Breastfeeding Unknown   BMI 25.70 kg/m    Physical Exam:  General: alert and cooperative Breasts: soft/nontender CV: RRR Pulm: nl effort Abdomen: soft, non-tender Uterine Fundus: firm Incision: n/a Perineum: minimal edema, lacerations repair well approximated Lochia: appropriate DVT Evaluation: No evidence of DVT seen on physical exam.  Recent Labs    05/25/19 0040 05/26/19 0600  HGB 9.8* 9.1*  HCT 31.2* 29.2*  WBC 7.6 12.5*  PLT 240 225    Assessment/Plan: 24 y.o. G1P1001 postpartum day # 1  -Continue routine postpartum care -Lactation consult PRN for breastfeeding   -Acute blood loss anemia - hemodynamically stable and asymptomatic; start PO ferrous sulfate BID with stool softeners  -Immunization status: Needs varicella prior to discharge/ all other immunizations up to date  Disposition: Continue inpatient postpartum care    LOS: 1 day   Kaiven Vester, CNM 05/26/2019, 8:09 AM

## 2019-05-27 LAB — SURGICAL PATHOLOGY

## 2019-05-27 NOTE — Lactation Note (Signed)
This note was copied from a baby's chart. Lactation Consultation Note  Patient Name: Kelly Ford Date: 05/27/2019 Reason for consult: Initial assessment  LC in to see mom and baby Kelly Ford. This is mom's first baby, and first breastfeeding experience. Mom and RN reports baby to be feeding well, voiding and stooling above expectations, and no concerns voiced. Baby was at the breast in football hold when [facility] entered the room, mom reporting cluster feeding overnight, and baby latching now, but falling asleep. LC educated parents on newborn feeding patterns, cluster feeding, growth spurts, tips for keeping baby awake at the breast. Discussed breastfeeding guidance for the days/weeks to come, signs of milk transfer, normal course of lactation, breast fullness and management, importance of on demand feeds and adequate emptying of the breast. Reviewed position and latch of infant. Information given for outpatient lactation consult services, and community breastfeeding support resources. Encouraged to call out today as needed before discharge.  Maternal Data Formula Feeding for Exclusion: No Has patient been taught Hand Expression?: Yes Does the patient have breastfeeding experience prior to this delivery?: No  Feeding Feeding Type: Breast Fed  LATCH Score                   Interventions Interventions: Breast feeding basics reviewed;Position options;Hand express  Lactation Tools Discussed/Used     Consult Status Consult Status: Complete    Kelly Ford 05/27/2019, 10:38 AM

## 2019-05-27 NOTE — Progress Notes (Signed)
Discharge order received from doctor.Varicella vaccine given at discharge. Reviewed discharge instructions and OTC medications with patient and answered all questions. Follow up appointment instructions given. Patient verbalized understanding. ID bands checked. Patient discharged home with infant via wheelchair by nursing/auxillary.    Oswald Hillock, RN

## 2019-12-11 ENCOUNTER — Other Ambulatory Visit: Payer: Self-pay | Admitting: Gastroenterology

## 2019-12-11 DIAGNOSIS — R110 Nausea: Secondary | ICD-10-CM

## 2019-12-18 ENCOUNTER — Ambulatory Visit
Admission: RE | Admit: 2019-12-18 | Discharge: 2019-12-18 | Disposition: A | Discharge status: Home / Self Care | Payer: BC Managed Care – PPO | Source: Ambulatory Visit | Attending: Gastroenterology | Admitting: Gastroenterology

## 2019-12-18 ENCOUNTER — Other Ambulatory Visit: Payer: Self-pay

## 2019-12-18 DIAGNOSIS — R110 Nausea: Secondary | ICD-10-CM | POA: Diagnosis present

## 2021-05-10 IMAGING — US US ABDOMEN LIMITED
1 series · 14 of 25 positions shown · non-contrast
Comparison: None.

CLINICAL DATA: Nausea

EXAM:
ULTRASOUND ABDOMEN LIMITED RIGHT UPPER QUADRANT

[Series 1: us abdomen limited ruq · 14 of 59 slices shown]
[im 1/59]
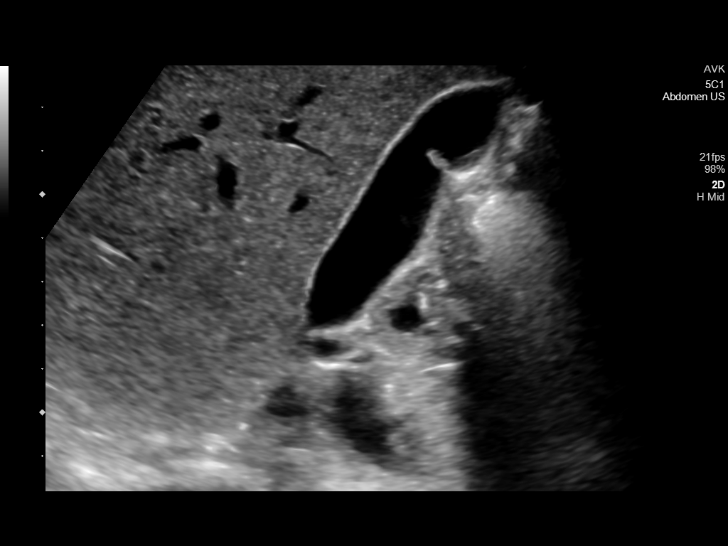
[im 5/59]
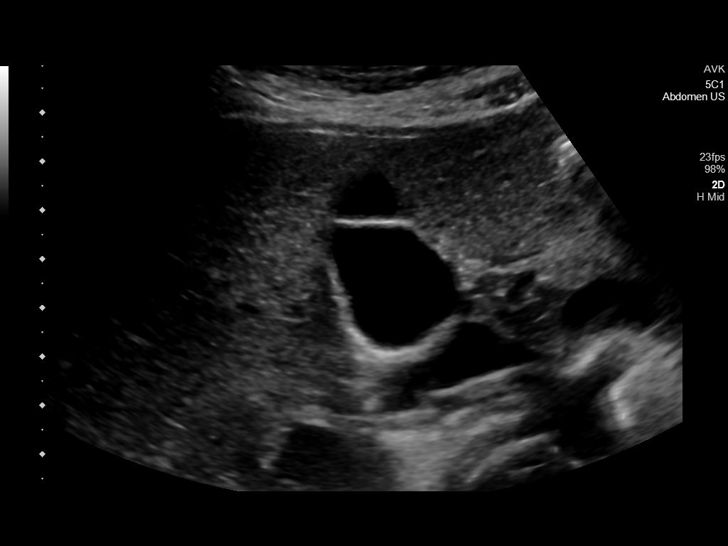
[im 10/59]
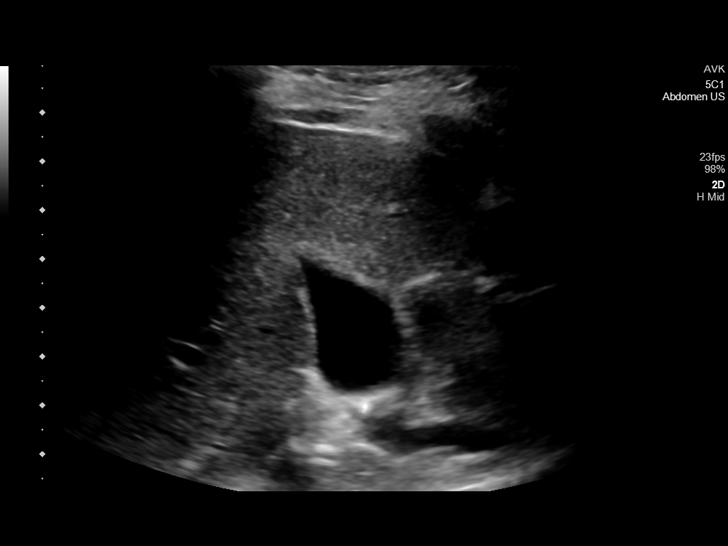
[im 15/59]
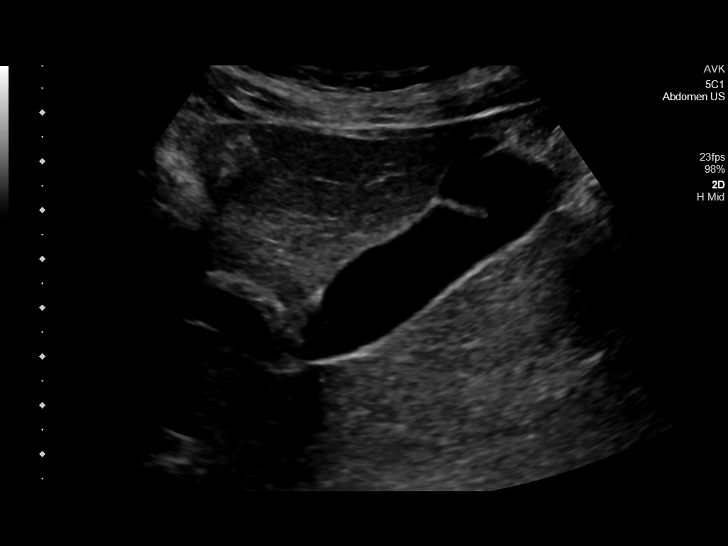
[im 20/59]
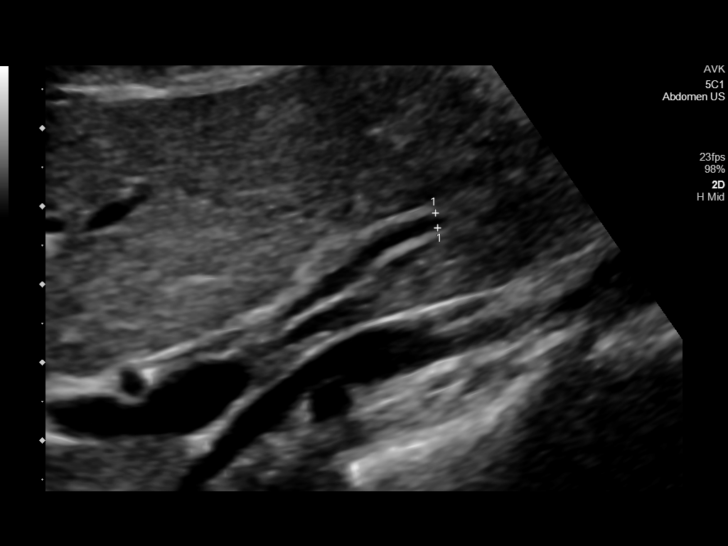
[im 22/59]
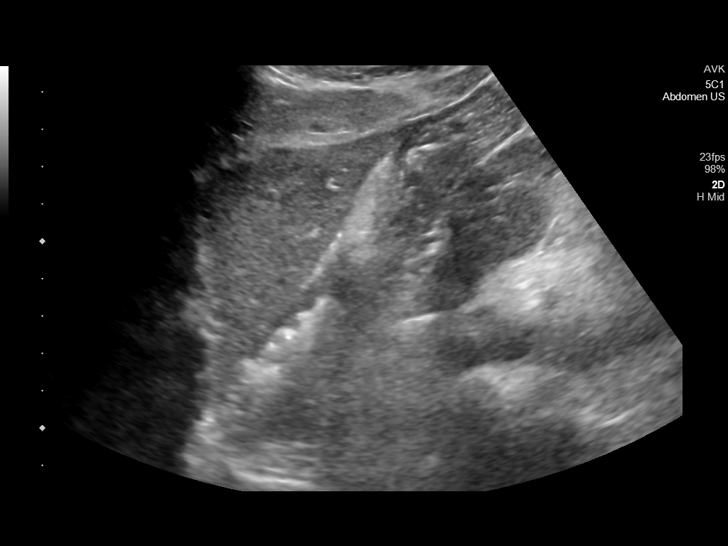
[im 27/59]
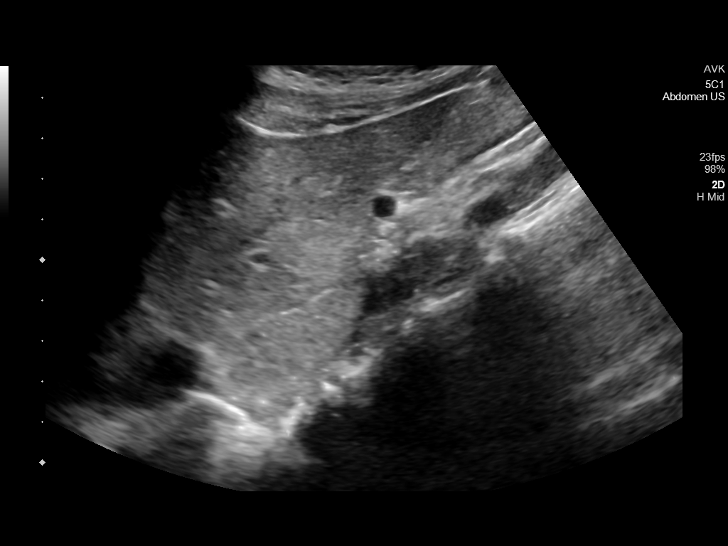
[im 32/59]
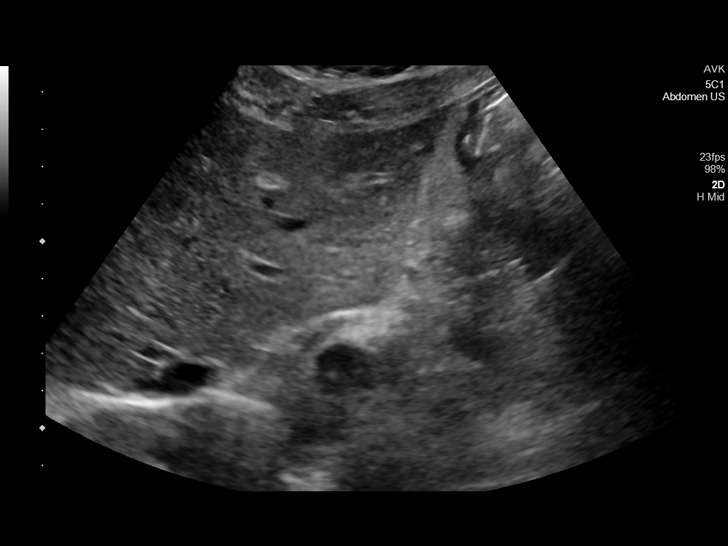
[im 37/59]
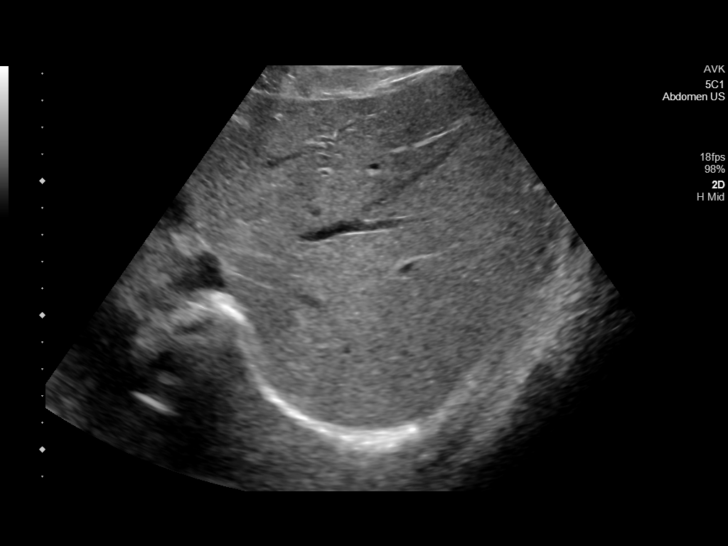
[im 39/59]
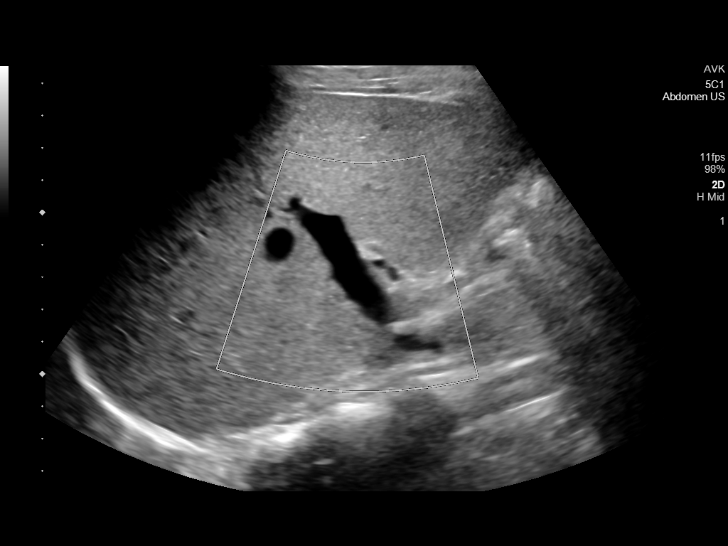
[im 44/59]
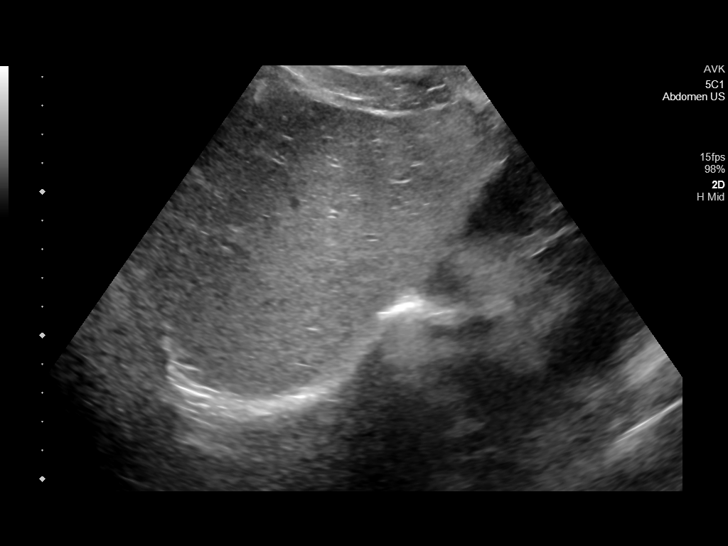
[im 49/59]
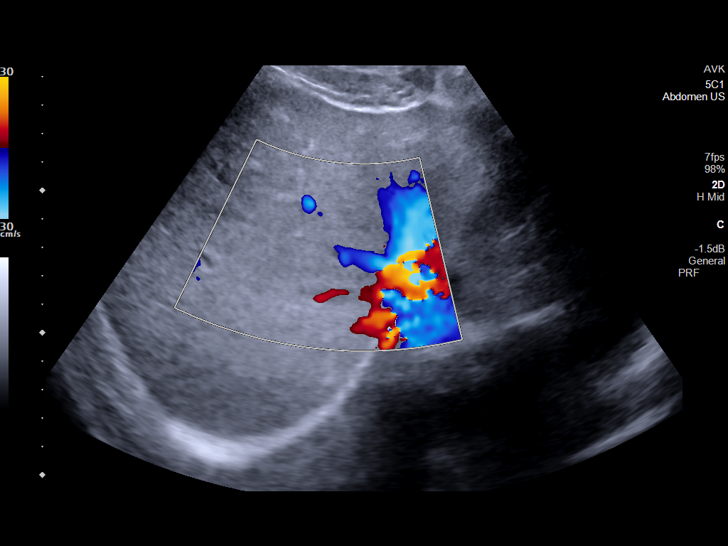
[im 54/59]
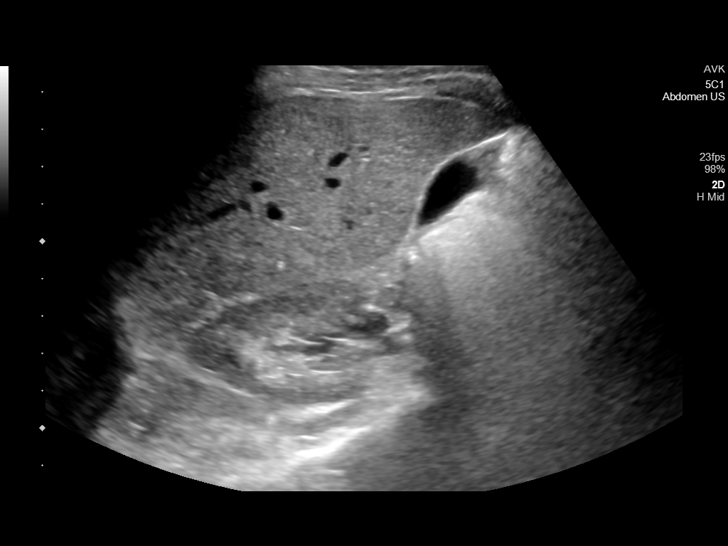
[im 59/59]
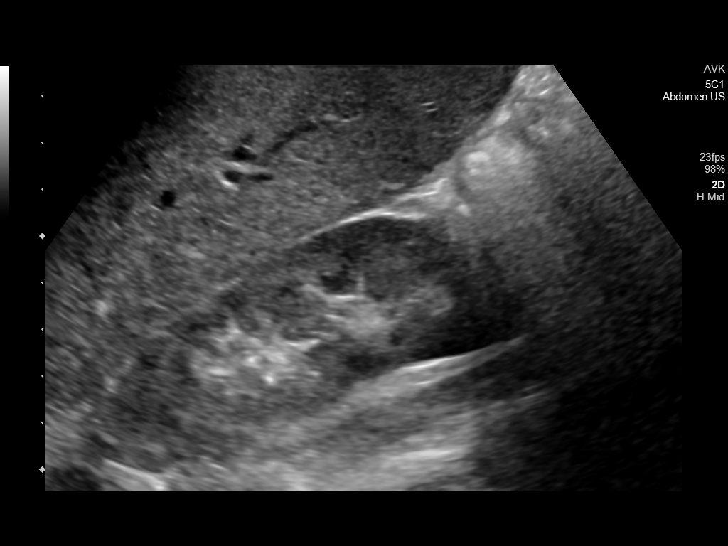

[14 of 25 positions shown; findings below may reference images not displayed]

FINDINGS: Gallbladder:

No gallstones or wall thickening visualized. There is no
pericholecystic fluid. No sonographic Murphy sign noted by
sonographer.

Common bile duct:

Diameter: 2 mm. No intrahepatic or extrahepatic biliary duct
dilatation.

Liver:

No focal lesion identified. Within normal limits in parenchymal
echogenicity. Portal vein is patent on color Doppler imaging with
normal direction of blood flow towards the liver.

Other: None.
IMPRESSION: Study within normal limits.

## 2021-05-15 NOTE — L&D Delivery Note (Signed)
Delivery Note  Kelly Ford is a [id number] at [redacted]w[redacted]d, Patient's last menstrual period was 06/15/2021 (approximate)., consistent with [place] at [redacted]w[redacted]d, Estimated Date of Delivery: 03/22/22  First Stage: Labor onset: 1900 Augmentation:  None Analgesia /Anesthesia intrapartum: None SROM at 0207 GBS: negative  Second Stage: Complete dilation at 0205 Onset of pushing at 0207 FHR second stage 135 bpm with moderate, variable decels with pushing   Kelly Ford presented to L&D with regular contractions.  She was observed in triage for early labor. Kelly Ford started to contract more painfully and reported increased pressure. She progressed quickly to C/C/+2 with a strong urge to push.  She pushed effectively over approximately  4 contractions for a quick spontaneous vaginal birth.  Delivery of a viable baby boy on 03/14/2022 at 0214 by CNM Delivery of fetal head in OA position with restitution to ROT. Loose nuchal cord x 1, easily reduced;  Anterior then posterior shoulders delivered easily with gentle downward traction. Baby placed on mom's chest, and attended to by baby RN Cord double clamped after cessation of pulsation, cut by father of baby.  Cord blood sample collection: Not Indicated A POS  Third Stage: Oxytocin bolus started after delivery of infant for hemorrhage prophylaxis  Placenta delivered intact with 3 VC @ 0222 Placenta disposition: discarded  Uterine tone firm / bleeding small  1st vaginal laceration identified  -Small tear noted over previous perineal scar.  Small amount of scar tissue seen.  Not bleeding significantly and approximates well.  Discussed using Lidocaine to numb the area to remove small scar tissue versus leaving to close by primary intention.  Kelly Ford does not desire intervention at this time.  Anesthesia for repair: N/A Repair Not indicated  Est. Blood Loss (mL): 093 ml  Complications: None  Mom to postpartum.  Baby to Couplet care / Skin to  Skin.  Newborn: Information for the patient's newborn:  Blakleigh, Straw [[id number]]  Live born female "Jonah" Birth Weight:  Pending  APGAR: 7, 9  Newborn Delivery   Birth date/time: 03/14/2022 02:14:00 Delivery type: Vaginal, Spontaneous     Feeding planned: breast feeding  ---------- Drinda Butts, CNM Certified Nurse Midwife [facility]

## 2021-08-04 DIAGNOSIS — Z3490 Encounter for supervision of normal pregnancy, unspecified, unspecified trimester: Secondary | ICD-10-CM | POA: Insufficient documentation

## 2021-08-04 DIAGNOSIS — O0990 Supervision of high risk pregnancy, unspecified, unspecified trimester: Secondary | ICD-10-CM | POA: Insufficient documentation

## 2021-09-12 DIAGNOSIS — O09293 Supervision of pregnancy with other poor reproductive or obstetric history, third trimester: Secondary | ICD-10-CM | POA: Insufficient documentation

## 2021-12-29 DIAGNOSIS — E611 Iron deficiency: Secondary | ICD-10-CM | POA: Insufficient documentation

## 2021-12-29 DIAGNOSIS — O99013 Anemia complicating pregnancy, third trimester: Secondary | ICD-10-CM | POA: Insufficient documentation

## 2022-02-24 ENCOUNTER — Other Ambulatory Visit: Payer: Self-pay | Admitting: Obstetrics

## 2022-02-24 DIAGNOSIS — D509 Iron deficiency anemia, unspecified: Secondary | ICD-10-CM

## 2022-02-24 NOTE — Progress Notes (Signed)
Patient is 36.[redacted] weeks pregnant with hgb 9.7. I have ordered iron infusions 300mg  weekly x4  Avelino Leeds CNM

## 2022-03-03 ENCOUNTER — Ambulatory Visit
Admission: RE | Admit: 2022-03-03 | Discharge: 2022-03-03 | Disposition: A | Discharge status: Home / Self Care | Payer: BC Managed Care – PPO | Source: Ambulatory Visit | Attending: Obstetrics | Admitting: Obstetrics

## 2022-03-03 DIAGNOSIS — Z3A00 Weeks of gestation of pregnancy not specified: Secondary | ICD-10-CM | POA: Diagnosis not present

## 2022-03-03 DIAGNOSIS — O99013 Anemia complicating pregnancy, third trimester: Secondary | ICD-10-CM | POA: Diagnosis present

## 2022-03-03 DIAGNOSIS — D509 Iron deficiency anemia, unspecified: Secondary | ICD-10-CM | POA: Insufficient documentation

## 2022-03-03 MED ORDER — SODIUM CHLORIDE 0.9 % IV SOLN
300.0000 mg | INTRAVENOUS | Status: DC
  Administered 2022-03-03: 300 mg via INTRAVENOUS
  Filled 2022-03-03: qty 300

## 2022-03-07 ENCOUNTER — Encounter: Payer: Self-pay | Admitting: Obstetrics and Gynecology

## 2022-03-07 ENCOUNTER — Other Ambulatory Visit: Payer: Self-pay

## 2022-03-07 ENCOUNTER — Observation Stay
Admission: RE | Admit: 2022-03-07 | Discharge: 2022-03-07 | Disposition: A | Discharge status: Home / Self Care | Payer: BC Managed Care – PPO | Attending: Obstetrics and Gynecology | Admitting: Obstetrics and Gynecology

## 2022-03-07 DIAGNOSIS — O320XX0 Maternal care for unstable lie, not applicable or unspecified: Principal | ICD-10-CM | POA: Insufficient documentation

## 2022-03-07 DIAGNOSIS — O321XX0 Maternal care for breech presentation, not applicable or unspecified: Secondary | ICD-10-CM | POA: Diagnosis present

## 2022-03-07 DIAGNOSIS — Z3A37 37 weeks gestation of pregnancy: Secondary | ICD-10-CM | POA: Diagnosis not present

## 2022-03-07 NOTE — Discharge Summary (Addendum)
Obstetrics Discharge Summary  Chief Complaint: here to have my baby turned  Subjective:  26 y.o. G71P1001 female at [redacted]w[redacted]d who presents for external cephalic version. She notes +FM, no LOF, no vaginal bleeding, and no contractions. She was noted to have a transverse presentation last week and presented today for a scheduled external cephalic version.  Objective: BP 113/76 (BP Location: Left Arm)   Pulse 77   Temp 97.6 F (36.4 C) (Axillary)   Resp 16   Ht 5\' 2"  (1.575 m)   Wt 54.4 kg   LMP 06/15/2021 (Approximate)   SpO2 100%   BMI 21.95 kg/m   Gen: NAD Pulm: regular work of breathing Abd: Gravid, NT Ext: no e/c/t  Bedside u/s: cephalic presentation, spine on maternal right. Placenta anterior.  Fluid: subjectively normal.  NST: Baseline FHR: 130 beats/min Variability: moderate Accelerations: present Decelerations: absent Tocometry: irritability with occasional obvious contractions   Interpretation:  INDICATIONS: unstable fetal lie, pre-procedure testing for proposed external cephalic version RESULTS:  A NST procedure was performed with FHR monitoring and a normal baseline established, appropriate time of 20-40 minutes of evaluation, and accels >2 seen w 15x15 characteristics.  Results show a REACTIVE NST.    Assessment and Plan: 26 y.o. G82P1001 female at [redacted]w[redacted]d with fetus with unstable lie, now in cephalic presentation.  - counseled patient about possible unstable lie and need to present to hospital if suspect ROM and labor.  Discussed possibility that fetus would be in non-cephalic presentation if moves again and doesn't move back into cephalic. - discussed risk of cord prolapse if ROM and malpresentation. - all questions answered.  Prentice Docker, MD, [facility] 03/07/2022 8:06 AM

## 2022-03-07 NOTE — Discharge Summary (Signed)
RN reviewed discharge instructions with patient. Gave patient opportunity for questions. All questions answered at this time. Pt verbalized understanding. Pt discharged home with significant other.  °

## 2022-03-08 ENCOUNTER — Encounter: Payer: Self-pay | Admitting: Obstetrics and Gynecology

## 2022-03-08 ENCOUNTER — Other Ambulatory Visit: Payer: Self-pay

## 2022-03-08 ENCOUNTER — Observation Stay
Admission: EM | Admit: 2022-03-08 | Discharge: 2022-03-08 | Disposition: A | Discharge status: Home / Self Care | Payer: BC Managed Care – PPO | Attending: Psychiatry | Admitting: Psychiatry

## 2022-03-08 DIAGNOSIS — D649 Anemia, unspecified: Secondary | ICD-10-CM | POA: Insufficient documentation

## 2022-03-08 DIAGNOSIS — Z3A38 38 weeks gestation of pregnancy: Secondary | ICD-10-CM | POA: Insufficient documentation

## 2022-03-08 DIAGNOSIS — O471 False labor at or after 37 completed weeks of gestation: Secondary | ICD-10-CM | POA: Diagnosis present

## 2022-03-08 DIAGNOSIS — O99013 Anemia complicating pregnancy, third trimester: Secondary | ICD-10-CM | POA: Diagnosis not present

## 2022-03-08 DIAGNOSIS — R198 Other specified symptoms and signs involving the digestive system and abdomen: Secondary | ICD-10-CM | POA: Diagnosis present

## 2022-03-08 NOTE — OB Triage Note (Signed)
Pt arrives G2 P1 with c/o ctx's since 2200-2300. Pt denies LOF or bleeding and is GBS-.

## 2022-03-08 NOTE — Discharge Summary (Signed)
Patient ID: Kelly Ford MRN: [id number] DOB/AGE: 13-Sep-1995 26 y.o.  Admit date: 03/08/2022 Discharge date: 03/08/2022  Admission Diagnoses: 26yo G2P1 at [redacted]w[redacted]d presenting with contractions since 2200.  Discharge Diagnoses: False vs early labor  Factors complicating pregnancy: Anemia in pregnancy  History of FGR Vertex but with unstable lie  Prenatal Procedures: NST  Consults: None  Significant Diagnostic Studies:  No results found for this or any previous visit (from the past 168 hour(s)).  Treatments: none  Hospital Course:  This is a 26 y.o. G2P1001 with IUP at [redacted]w[redacted]d seen for a labor check, noted to have a cervical exam of 2.5/60/-2.  No leaking of fluid and no bleeding.  She was observed, fetal heart rate monitoring remained reassuring, and she had no signs/symptoms of progressing labor or other maternal-fetal concerns.  Her cervical exam was unchanged from admission.  She was deemed stable for discharge to home with outpatient follow up.  Discharge Physical Exam:  BP 115/60   Pulse 76   Temp 97.7 F (36.5 C) (Oral)   Resp 20   Ht 5\' 1"  (1.549 m)   Wt 54.4 kg   LMP 06/15/2021 (Approximate)   BMI 22.67 kg/m   General: NAD CV: RRR Pulm: CTABL, nl effort ABD: s/nd/nt, gravid DVT Evaluation: LE non-ttp, no evidence of DVT on exam.  NST: FHR baseline: 125 bpm Variability: moderate Accelerations: yes Decelerations: none Category/reactivity: reactive   TOCO: quiet SVE: deferred  Dilation: 2.5 Effacement (%): 60 Cervical Position: Middle Station: -2 Presentation: Vertex Exam by:: Coral Ceo, RN   Discharge Condition: Stable  Disposition: Discharge disposition: 01-Home or Self Care        Allergies as of 03/08/2022   No Known Allergies      Medication List     STOP taking these medications    acetaminophen 325 MG tablet Commonly known as: TYLENOL   diphenhydrAMINE 25 mg capsule Commonly known as: BENADRYL   GLUCOSAMINE  SULFATE PO   ibuprofen 200 MG tablet Commonly known as: ADVIL   ZyrTEC Allergy 10 MG Caps Generic drug: Cetirizine HCl       TAKE these medications    ascorbic acid 1000 MG tablet Commonly known as: VITAMIN C Take by mouth.   Biotin 10 MG Caps Take by mouth.   ferrous sulfate 325 (65 FE) MG tablet Take 325 mg by mouth daily with breakfast.   Lysine HCl 1000 MG Tabs Take 2,000 mg by mouth.   multivitamin capsule Take 1 capsule by mouth daily.   pyridOXINE 100 MG tablet Commonly known as: VITAMIN B6 Take 100 mg by mouth daily.   Vitamin D3 10 MCG (400 UNIT) Caps Take 1 capsule by mouth daily.   Vitamin E 100 units Tabs Take by mouth.         SignedLinda Hedges, CNM 03/08/2022 7:11 AM

## 2022-03-13 ENCOUNTER — Encounter: Payer: Self-pay | Admitting: Obstetrics and Gynecology

## 2022-03-13 ENCOUNTER — Inpatient Hospital Stay
Admission: EM | Admit: 2022-03-13 | Discharge: 2022-03-15 | DRG: 807 | Disposition: A | Discharge status: Home / Self Care | Payer: BC Managed Care – PPO

## 2022-03-13 ENCOUNTER — Other Ambulatory Visit: Payer: Self-pay

## 2022-03-13 DIAGNOSIS — O26893 Other specified pregnancy related conditions, third trimester: Secondary | ICD-10-CM | POA: Diagnosis not present

## 2022-03-13 DIAGNOSIS — Z3A38 38 weeks gestation of pregnancy: Secondary | ICD-10-CM

## 2022-03-13 DIAGNOSIS — O479 False labor, unspecified: Principal | ICD-10-CM | POA: Diagnosis present

## 2022-03-13 MED ORDER — ACETAMINOPHEN 500 MG PO TABS
1000.0000 mg | ORAL_TABLET | Freq: Four times a day (QID) | ORAL | Status: DC | PRN
  Administered 2022-03-14: 1000 mg via ORAL
  Filled 2022-03-13: qty 2

## 2022-03-13 MED ORDER — CALCIUM CARBONATE ANTACID 500 MG PO CHEW: 2.0000 | CHEWABLE_TABLET | ORAL | Status: DC | PRN

## 2022-03-13 NOTE — OB Triage Note (Signed)
Patient is a G3P1001 at 38wk5d coming in for contractions. Patient reports bloody show and loss of her mucus plug appx 2 days ago. Patient confirms fetal movement. Patient denies leaking of bright red blood or fluid. Initial FHT 130bpm. Provider performed SVE, confirming patient 3/70/-1. Continuing to assess.

## 2022-03-14 ENCOUNTER — Encounter: Payer: Self-pay | Admitting: Obstetrics and Gynecology

## 2022-03-14 DIAGNOSIS — O26893 Other specified pregnancy related conditions, third trimester: Secondary | ICD-10-CM | POA: Diagnosis present

## 2022-03-14 DIAGNOSIS — Z3A38 38 weeks gestation of pregnancy: Secondary | ICD-10-CM | POA: Diagnosis not present

## 2022-03-14 LAB — CBC
HCT: 36.7 % (ref 36.0–46.0)
Hemoglobin: 11.6 g/dL — ABNORMAL LOW (ref 12.0–15.0)
MCH: 25.1 pg — ABNORMAL LOW (ref 26.0–34.0)
MCHC: 31.6 g/dL (ref 30.0–36.0)
MCV: 79.4 fL — ABNORMAL LOW (ref 80.0–100.0)
Platelets: 275 K/uL (ref 150–400)
RBC: 4.62 MIL/uL (ref 3.87–5.11)
RDW: 17.6 % — ABNORMAL HIGH (ref 11.5–15.5)
WBC: 11 K/uL — ABNORMAL HIGH (ref 4.0–10.5)
nRBC: 0 % (ref 0.0–0.2)

## 2022-03-14 LAB — COMPREHENSIVE METABOLIC PANEL
ALT: 10 U/L (ref 0–44)
AST: 23 U/L (ref 15–41)
Albumin: 3.5 g/dL (ref 3.5–5.0)
Alkaline Phosphatase: 114 U/L (ref 38–126)
Anion gap: 8 (ref 5–15)
BUN: 9 mg/dL (ref 6–20)
CO2: 21 mmol/L — ABNORMAL LOW (ref 22–32)
Calcium: 9.1 mg/dL (ref 8.9–10.3)
Chloride: 104 mmol/L (ref 98–111)
Creatinine, Ser: 0.72 mg/dL (ref 0.44–1.00)
GFR, Estimated: >60 mL/min (ref >60)
Glucose, Bld: 77 mg/dL (ref 70–99)
Potassium: 3.8 mmol/L (ref 3.5–5.1)
Sodium: 133 mmol/L — ABNORMAL LOW (ref 135–145)
Total Bilirubin: 0.6 mg/dL (ref 0.3–1.2)
Total Protein: 7.8 g/dL (ref 6.5–8.1)

## 2022-03-14 LAB — TYPE AND SCREEN
ABO/RH(D): A POS
Antibody Screen: NEGATIVE

## 2022-03-14 MED ORDER — AMMONIA AROMATIC IN INHA
RESPIRATORY_TRACT | Status: AC
  Filled 2022-03-14: qty 10

## 2022-03-14 MED ORDER — DIPHENHYDRAMINE HCL 25 MG PO CAPS
25.0000 mg | ORAL_CAPSULE | Freq: Once | ORAL | Status: AC | PRN
  Administered 2022-03-14: 25 mg via ORAL
  Filled 2022-03-14: qty 1

## 2022-03-14 MED ORDER — FENTANYL CITRATE (PF) 100 MCG/2ML IJ SOLN: 50.0000 ug | INTRAMUSCULAR | Status: DC | PRN

## 2022-03-14 MED ORDER — WITCH HAZEL-GLYCERIN EX PADS
1.0000 | MEDICATED_PAD | CUTANEOUS | Status: DC | PRN
  Filled 2022-03-14: qty 100

## 2022-03-14 MED ORDER — COCONUT OIL OIL
1.0000 | TOPICAL_OIL | Status: DC | PRN
  Filled 2022-03-14: qty 7.5

## 2022-03-14 MED ORDER — IBUPROFEN 600 MG PO TABS
600.0000 mg | ORAL_TABLET | Freq: Four times a day (QID) | ORAL | Status: DC
  Administered 2022-03-14 – 2022-03-15 (×5): 600 mg via ORAL
  Filled 2022-03-14 (×5): qty 1

## 2022-03-14 MED ORDER — ONDANSETRON HCL 4 MG PO TABS: 4.0000 mg | ORAL_TABLET | ORAL | Status: DC | PRN

## 2022-03-14 MED ORDER — OXYTOCIN 10 UNIT/ML IJ SOLN
INTRAMUSCULAR | Status: AC
  Filled 2022-03-14: qty 2

## 2022-03-14 MED ORDER — LIDOCAINE HCL (PF) 1 % IJ SOLN: 30.0000 mL | INTRAMUSCULAR | Status: DC | PRN

## 2022-03-14 MED ORDER — LACTATED RINGERS IV BOLUS
1000.0000 mL | Freq: Once | INTRAVENOUS | Status: AC
  Administered 2022-03-14: 1000 mL via INTRAVENOUS

## 2022-03-14 MED ORDER — SODIUM CHLORIDE 0.9% FLUSH: 3.0000 mL | Freq: Two times a day (BID) | INTRAVENOUS | Status: DC

## 2022-03-14 MED ORDER — MISOPROSTOL 200 MCG PO TABS
ORAL_TABLET | ORAL | Status: AC
  Filled 2022-03-14: qty 4

## 2022-03-14 MED ORDER — SIMETHICONE 80 MG PO CHEW: 80.0000 mg | CHEWABLE_TABLET | ORAL | Status: DC | PRN

## 2022-03-14 MED ORDER — PANTOPRAZOLE SODIUM 40 MG IV SOLR
20.0000 mg | Freq: Once | INTRAVENOUS | Status: AC
  Administered 2022-03-14: 20 mg via INTRAVENOUS
  Filled 2022-03-14: qty 5

## 2022-03-14 MED ORDER — LACTATED RINGERS IV SOLN: 500.0000 mL | INTRAVENOUS | Status: DC | PRN

## 2022-03-14 MED ORDER — LIDOCAINE HCL (PF) 1 % IJ SOLN
INTRAMUSCULAR | Status: AC
  Filled 2022-03-14: qty 30

## 2022-03-14 MED ORDER — FERROUS SULFATE 325 (65 FE) MG PO TABS
325.0000 mg | ORAL_TABLET | Freq: Two times a day (BID) | ORAL | Status: DC
  Administered 2022-03-14 – 2022-03-15 (×3): 325 mg via ORAL
  Filled 2022-03-14 (×3): qty 1

## 2022-03-14 MED ORDER — LACTATED RINGERS IV SOLN: INTRAVENOUS | Status: DC

## 2022-03-14 MED ORDER — ONDANSETRON HCL 4 MG/2ML IJ SOLN: 4.0000 mg | INTRAMUSCULAR | Status: DC | PRN

## 2022-03-14 MED ORDER — OXYTOCIN-SODIUM CHLORIDE 30-0.9 UT/500ML-% IV SOLN
INTRAVENOUS | Status: AC
  Administered 2022-03-14: 333 mL via INTRAVENOUS
  Filled 2022-03-14: qty 500

## 2022-03-14 MED ORDER — ZOLPIDEM TARTRATE 5 MG PO TABS: 5.0000 mg | ORAL_TABLET | Freq: Every evening | ORAL | Status: DC | PRN

## 2022-03-14 MED ORDER — OXYTOCIN BOLUS FROM INFUSION: 333.0000 mL | Freq: Once | INTRAVENOUS | Status: AC

## 2022-03-14 MED ORDER — ONDANSETRON HCL 4 MG/2ML IJ SOLN: 4.0000 mg | Freq: Four times a day (QID) | INTRAMUSCULAR | Status: DC | PRN

## 2022-03-14 MED ORDER — SOD CITRATE-CITRIC ACID 500-334 MG/5ML PO SOLN: 30.0000 mL | ORAL | Status: DC | PRN

## 2022-03-14 MED ORDER — PRENATAL MULTIVITAMIN CH
1.0000 | ORAL_TABLET | Freq: Every day | ORAL | Status: DC
  Administered 2022-03-15: 1 via ORAL
  Filled 2022-03-14: qty 1

## 2022-03-14 MED ORDER — SENNOSIDES-DOCUSATE SODIUM 8.6-50 MG PO TABS
2.0000 | ORAL_TABLET | Freq: Every day | ORAL | Status: DC
  Administered 2022-03-15: 2 via ORAL
  Filled 2022-03-14: qty 2

## 2022-03-14 MED ORDER — BENZOCAINE-MENTHOL 20-0.5 % EX AERO
1.0000 | INHALATION_SPRAY | CUTANEOUS | Status: DC | PRN
  Filled 2022-03-14 (×2): qty 56

## 2022-03-14 MED ORDER — ACETAMINOPHEN 500 MG PO TABS
1000.0000 mg | ORAL_TABLET | Freq: Four times a day (QID) | ORAL | Status: DC
  Administered 2022-03-14 – 2022-03-15 (×5): 1000 mg via ORAL
  Filled 2022-03-14 (×5): qty 2

## 2022-03-14 MED ORDER — SODIUM CHLORIDE 0.9% FLUSH: 3.0000 mL | INTRAVENOUS | Status: DC | PRN

## 2022-03-14 MED ORDER — OXYTOCIN-SODIUM CHLORIDE 30-0.9 UT/500ML-% IV SOLN
2.5000 [IU]/h | INTRAVENOUS | Status: DC
  Administered 2022-03-14: 2.5 [IU]/h via INTRAVENOUS

## 2022-03-14 MED ORDER — DIPHENHYDRAMINE HCL 25 MG PO CAPS: 25.0000 mg | ORAL_CAPSULE | Freq: Four times a day (QID) | ORAL | Status: DC | PRN

## 2022-03-14 MED ORDER — DIBUCAINE (PERIANAL) 1 % EX OINT
1.0000 | TOPICAL_OINTMENT | CUTANEOUS | Status: DC | PRN
  Filled 2022-03-14: qty 28
  Filled 2022-03-14: qty 56

## 2022-03-14 MED ORDER — SODIUM CHLORIDE 0.9 % IV SOLN: 250.0000 mL | INTRAVENOUS | Status: DC | PRN
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
## 2022-03-14 NOTE — Progress Notes (Signed)
Post Partum Day 0 Subjective: Doing well, no complaints.  Tolerating regular diet, pain with PO meds, voiding and ambulating without difficulty.  No CP SOB Fever,Chills, N/V or leg pain; denies nipple or breast pain, no HA change of vision, RUQ/epigastric pain  Objective: BP 104/67 (BP Location: Left Arm)   Pulse 69   Temp 97.6 F (36.4 C) (Oral)   Resp 18   Ht 5\' 1"  (1.549 m)   Wt 54.4 kg   LMP 06/15/2021 (Approximate)   SpO2 98%   Breastfeeding Unknown   BMI 22.67 kg/m    Physical Exam:  General: NAD Breasts: soft/nontender CV: RRR Pulm: nl effort, CTABL Abdomen: soft, NT, BS x 4 Perineum: minimal edema, intact/lacerations hemostatic Lochia: scant Uterine Fundus: fundus firm and 1 fb below umbilicus DVT Evaluation: no cords, ttp LEs   Recent Labs    03/14/22 0108  HGB 11.6*  HCT 36.7  WBC 11.0*  PLT 275    Assessment/Plan: 26 y.o. [id number] postpartum day # 1  - Continue routine PP care - Lactation consult prn - repeat CBC in AM - Immunization status:  all Imms up to date    Disposition: Does not desire Dc home today.     Francetta Found, CNM 03/14/2022  7:34 PM

## 2022-03-14 NOTE — Discharge Instructions (Signed)
Vaginal Delivery, Care After Refer to this sheet in the next few weeks. These discharge instructions provide you with information on caring for yourself after delivery. Your caregiver may also give you specific instructions. Your treatment has been planned according to the most current medical practices available, but problems sometimes occur. Call your caregiver if you have any problems or questions after you go home. HOME CARE INSTRUCTIONS Take over-the-counter or prescription medicines only as directed by your caregiver or pharmacist. Do not drink alcohol, especially if you are breastfeeding or taking medicine to relieve pain. Do not smoke tobacco. Continue to use good perineal care. Good perineal care includes: Wiping your perineum from back to front Keeping your perineum clean. You can do sitz baths twice a day, to help keep this area clean Do not use tampons, douche or have sex until your caregiver says it is okay. Shower only and avoid sitting in submerged water, aside from sitz baths Wear a well-fitting bra that provides breast support. Eat healthy foods. Drink enough fluids to keep your urine clear or pale yellow. Eat high-fiber foods such as whole grain cereals and breads, brown rice, beans, and fresh fruits and vegetables every day. These foods may help prevent or relieve constipation. Avoid constipation with high fiber foods or medications, such as miralax or metamucil Follow your caregiver's recommendations regarding resumption of activities such as climbing stairs, driving, lifting, exercising, or traveling. Talk to your caregiver about resuming sexual activities. Resumption of sexual activities is dependent upon your risk of infection, your rate of healing, and your comfort and desire to resume sexual activity. Try to have someone help you with your household activities and your newborn for at least a few days after you leave the hospital. Rest as much as possible. Try to rest or  take a nap when your newborn is sleeping. Increase your activities gradually. Keep all of your scheduled postpartum appointments. It is very important to keep your scheduled follow-up appointments. At these appointments, your caregiver will be checking to make sure that you are healing physically and emotionally. SEEK MEDICAL CARE IF:  You are passing large clots from your vagina. Save any clots to show your caregiver. You have a foul smelling discharge from your vagina. You have trouble urinating. You are urinating frequently. You have pain when you urinate. You have a change in your bowel movements. You have increasing redness, pain, or swelling near your vaginal incision (episiotomy) or vaginal tear. You have pus draining from your episiotomy or vaginal tear. Your episiotomy or vaginal tear is separating. You have painful, hard, or reddened breasts. You have a severe headache. You have blurred vision or see spots. You feel sad or depressed. You have thoughts of hurting yourself or your newborn. You have questions about your care, the care of your newborn, or medicines. You are dizzy or light-headed. You have a rash. You have nausea or vomiting. You were breastfeeding and have not had a menstrual period within 12 weeks after you stopped breastfeeding. You are not breastfeeding and have not had a menstrual period by the 12th week after delivery. You have a fever. SEEK IMMEDIATE MEDICAL CARE IF:  You have persistent pain. You have chest pain. You have shortness of breath. You faint. You have leg pain. You have stomach pain. Your vaginal bleeding saturates two or more sanitary pads in 1 hour. MAKE SURE YOU:  Understand these instructions. Will watch your condition. Will get help right away if you are not doing well or   get worse. Document Released: 04/28/2000 Document Revised: 09/15/2013 Document Reviewed: 12/27/2011 ExitCare Patient Information 2015 [employer]. This  information is not intended to replace advice given to you by your health care provider. Make sure you discuss any questions you have with your health care provider.  Sitz Bath A sitz bath is a warm water bath taken in the sitting position. The water covers only the hips and butt (buttocks). We recommend using one that fits in the toilet, to help with ease of use and cleanliness. It may be used for either healing or cleaning purposes. Sitz baths are also used to relieve pain, itching, or muscle tightening (spasms). The water may contain medicine. Moist heat will help you heal and relax.  HOME CARE  Take 3 to 4 sitz baths a day. Fill the bathtub half-full with warm water. Sit in the water and open the drain a little. Turn on the warm water to keep the tub half-full. Keep the water running constantly. Soak in the water for 15 to 20 minutes. After the sitz bath, pat the affected area dry. GET HELP RIGHT AWAY IF: You get worse instead of better. Stop the sitz baths if you get worse. MAKE SURE YOU: Understand these instructions. Will watch your condition. Will get help right away if you are not doing well or get worse. Document Released: 06/08/2004 Document Revised: 01/24/2012 Document Reviewed: 08/29/2010 ExitCare Patient Information 2015 [employer]. This information is not intended to replace advice given to you by your health care provider. Make sure you discuss any questions you have with your health care provider.   

## 2022-03-14 NOTE — Discharge Summary (Signed)
Obstetrical Discharge Summary  Patient Name: Kelly Ford DOB: 01-12-1996 MRN: [id number]  Date of Admission: 03/13/2022 Date of Delivery: 03/14/2022 Delivered by: Drinda Butts, CNM  Date of Discharge: 03/15/2022  Primary OB: [facility] [id number] last menstrual period was 06/15/2021 (approximate). EDC Estimated Date of Delivery: 03/22/22 Gestational Age at Delivery: [redacted]w[redacted]d   Antepartum complications:  Maternal iron deficiency in pregnancy History of FGR Unstable lie - currently cephalic   Admitting Diagnosis: Uterine contractions during pregnancy [O47.9] Normal labor [O80, Z37.9]  Secondary Diagnosis: Patient Active Problem List   Diagnosis Date Noted   Normal labor 03/14/2022   Uterine contractions during pregnancy 03/13/2022   Anemia affecting pregnancy in third trimester 12/29/2021   History of intrauterine growth restriction in prior pregnancy, currently pregnant, third trimester 09/12/2021   Supervision of normal pregnancy 08/04/2021   Acid reflux 06/21/2015    Discharge Diagnosis: Term Pregnancy Delivered      Augmentation: N/A Complications: None Intrapartum complications/course: Viana presented to L&D with regular contractions.  She was observed in triage for early labor. Yuri started to contract more painfully and reported increased pressure. She progressed quickly to C/C/+2 with a strong urge to push.  She pushed effectively over approximately  4 contractions for a quick spontaneous vaginal birth.  Delivery Type: spontaneous vaginal delivery Anesthesia: None Placenta: spontaneous To Pathology: No  Laceration: 1st degree vaginal Episiotomy: none Newborn Data: Live born female "Jonah" Birth Weight:  5 lbs 11.7oz APGAR: 7, 9  Newborn Delivery   Birth date/time: 03/14/2022 02:14:00 Delivery type: Vaginal, Spontaneous      Postpartum Procedures: none [place]:     03/14/2022   11:30 AM 05/26/2019    3:30 PM  Edinburgh Postnatal  Depression Scale Screening Tool  I have been able to laugh and see the funny side of things. 0 0  I have looked forward with enjoyment to things. 0 0  I have blamed myself unnecessarily when things went wrong. 2 1  I have been anxious or worried for no good reason. 2 1  I have felt scared or panicky for no good reason. 1 0  Things have been getting on top of me. 1 0  I have been so unhappy that I have had difficulty sleeping. 0 0  I have felt sad or miserable. 0 0  I have been so unhappy that I have been crying. 1 0  The thought of harming myself has occurred to me. 0 0  Edinburgh Postnatal Depression Scale Total 7 2     Post partum course:  Patient had an uncomplicated postpartum course.  By time of discharge on PPD#2, her pain was controlled on oral pain medications; she had appropriate lochia and was ambulating, voiding without difficulty and tolerating regular diet.  She was deemed stable for discharge to home.    Discharge Physical Exam:  BP 97/61 (BP Location: Left Arm)   Pulse 78   Temp 97.6 F (36.4 C) (Oral)   Resp 17   Ht 5\' 1"  (1.549 m)   Wt 54.4 kg   LMP 06/15/2021 (Approximate)   SpO2 99%   Breastfeeding Unknown   BMI 22.67 kg/m   General: NAD CV: RRR Pulm: CTABL, nl effort ABD: s/nd/nt, fundus firm and below the umbilicus Lochia: moderate Perineum: minimal edema/intact DVT Evaluation: LE non-ttp, no evidence of DVT on exam.  Hemoglobin  Date Value Ref Range Status  03/15/2022 9.3 (L) 12.0 - 15.0 g/dL Final   HCT  Date Value Ref Range Status  03/15/2022 29.3 (L) 36.0 - 46.0 % Final    Risk assessment for postpartum VTE and prophylactic treatment: Very high risk factors: None High risk factors: None Moderate risk factors: None  Postpartum VTE prophylaxis with LMWH not indicated  Disposition: stable, discharge to home. Baby Feeding: breast feeding Baby Disposition: home with mom  Rh Immune globulin indicated: No Rubella vaccine given: was not  indicated Varivax vaccine given: was not indicated Flu vaccine given in AP setting: Yes  Tdap vaccine given in AP setting: Yes   Contraception:  LAM  Prenatal Labs:  Blood type/Rh A POS   Antibody screen Negative    Rubella Immune    Varicella Immune  RPR NR    HBsAg NR   Hep C NR   HIV NR    GC neg  Chlamydia neg  Genetic screening cfDNA negative   1 hour GTT 90  3 hour GTT N/A  GBS Neg      Plan:  Farran Amsden was discharged to home in good condition. Follow-up appointment with delivering provider in 6 weeks.  Discharge Medications: Allergies as of 03/15/2022   No Known Allergies      Medication List     TAKE these medications    acetaminophen 500 MG tablet Commonly known as: TYLENOL Take 2 tablets (1,000 mg total) by mouth every 6 (six) hours.   ascorbic acid 1000 MG tablet Commonly known as: VITAMIN C Take by mouth.   Biotin 10 MG Caps Take by mouth.   ferrous sulfate 325 (65 FE) MG tablet Take 325 mg by mouth daily with breakfast.   ibuprofen 600 MG tablet Commonly known as: ADVIL Take 1 tablet (600 mg total) by mouth every 6 (six) hours as needed.   Lysine HCl 1000 MG Tabs Take 2,000 mg by mouth.   multivitamin capsule Take 1 capsule by mouth daily.   Vitamin D3 10 MCG (400 UNIT) Caps Take 1 capsule by mouth daily.   Vitamin E 100 units Tabs Take by mouth.         Follow-up Information     Gustavo Lah, CNM. Schedule an appointment as soon as possible for a visit in 6 week(s).   Specialty: Certified Nurse Midwife Why: postpartum visit Contact information: [address] [place] [address] [phone]                 Signed:  Margaretmary Eddy, CNM Certified Nurse Midwife [facility]

## 2022-03-15 LAB — CBC
HCT: 29.3 % — ABNORMAL LOW (ref 36.0–46.0)
Hemoglobin: 9.3 g/dL — ABNORMAL LOW (ref 12.0–15.0)
MCH: 25.2 pg — ABNORMAL LOW (ref 26.0–34.0)
MCHC: 31.7 g/dL (ref 30.0–36.0)
MCV: 79.4 fL — ABNORMAL LOW (ref 80.0–100.0)
Platelets: 233 10*3/uL (ref 150–400)
RBC: 3.69 MIL/uL — ABNORMAL LOW (ref 3.87–5.11)
RDW: 17.9 % — ABNORMAL HIGH (ref 11.5–15.5)
WBC: 10.9 10*3/uL — ABNORMAL HIGH (ref 4.0–10.5)
nRBC: 0 % (ref 0.0–0.2)

## 2022-03-15 MED ORDER — ACETAMINOPHEN 500 MG PO TABS: 1000.0000 mg | ORAL_TABLET | Freq: Four times a day (QID) | ORAL | 0 refills | Status: AC

## 2022-03-15 MED ORDER — IBUPROFEN 600 MG PO TABS: 600.0000 mg | ORAL_TABLET | Freq: Four times a day (QID) | ORAL | 0 refills | Status: DC | PRN

## 2022-03-15 NOTE — Progress Notes (Signed)
Patient discharged home with infant. Discharge instructions and prescriptions given and reviewed with patient. Patient verbalized understanding. Escorted out by volunteer.  

## 2022-03-15 NOTE — Lactation Note (Signed)
This note was copied from a baby's chart. Lactation Consultation Note  Patient Name: Kelly Ford [id number] Date: 03/15/2022 Reason for consult: Follow-up assessment;Early term 37-38.6wks;Infant < 6lbs Age:25 hours  Maternal Data Has patient been taught Hand Expression?: Yes Does the patient have breastfeeding experience prior to this delivery?: Yes How long did the patient breastfeed?: 6 months  P2, SVD 32 hours ago. Has been independent with all feedings, BF hx and has no concerns with this baby.  Feeding Mother's Current Feeding Choice: Breast Milk  Baby feeding well, voiding/stooling, weight loss at 3%, and passed 24hr tests.  LATCH Score Latch: Repeated attempts needed to sustain latch, nipple held in mouth throughout feeding, stimulation needed to elicit sucking reflex.  Audible Swallowing: Spontaneous and intermittent  Type of Nipple: Everted at rest and after stimulation  Comfort (Breast/Nipple): Soft / non-tender  Hold (Positioning): No assistance needed to correctly position infant at breast.  LATCH Score: 9  Baby active at breast upon entry. Displayed flanged lips, wide mouth, and rhythmic suck pattern. Swallows heard.  Lactation Tools Discussed/Used    Interventions Interventions: Breast feeding basics reviewed;Hand express;Pre-pump if needed;Education  Reviewed positioning and deep latch, possible need to soften breast prior to latching when milk transitions in, cluster feeding/feeding on demand, minimizing exposure to bottles/pacifiers for first 3-4 weeks.  Discharge Discharge Education: Engorgement and breast care;Outpatient recommendation Pump: Personal  Brief guidance on tips to manage breast fullness/engorgement.  Consult Status Consult Status: Complete  Outpatient lactation number provided with discharge paperwork. Encouraged to call with questions and ongoing support as needed.  KERENSA NICKLAS 03/15/2022, 10:38 AM

## 2022-03-15 NOTE — Plan of Care (Signed)
All education and care completed with patient. AVS given to patient and all questions answered.

## 2023-01-02 ENCOUNTER — Ambulatory Visit: Payer: BC Managed Care – PPO | Attending: Obstetrics and Gynecology

## 2023-01-02 ENCOUNTER — Other Ambulatory Visit: Payer: Self-pay

## 2023-01-02 DIAGNOSIS — R2689 Other abnormalities of gait and mobility: Secondary | ICD-10-CM | POA: Insufficient documentation

## 2023-01-02 DIAGNOSIS — R293 Abnormal posture: Secondary | ICD-10-CM | POA: Diagnosis present

## 2023-01-02 DIAGNOSIS — M6281 Muscle weakness (generalized): Secondary | ICD-10-CM | POA: Diagnosis present

## 2023-01-02 DIAGNOSIS — R102 Pelvic and perineal pain: Secondary | ICD-10-CM | POA: Insufficient documentation

## 2023-01-02 NOTE — Patient Instructions (Signed)
TOILET POSTURE: Urination: feet flat, lean forward with forearms on legs to fully empty bladder. Bowel movement: place feet flat on Squatty Potty or stool so knees are higher than hips, lean forward to relax pelvic floor in order to avoid strain.  Overall posture: try not to cross legs at knees or ankles. Do figure four hip stretch instead.  Water: drink 8 oz. Of water first thing in morning.
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
## 2023-01-09 ENCOUNTER — Ambulatory Visit: Payer: BC Managed Care – PPO

## 2023-01-09 ENCOUNTER — Other Ambulatory Visit: Payer: Self-pay

## 2023-01-09 DIAGNOSIS — R102 Pelvic and perineal pain: Secondary | ICD-10-CM

## 2023-01-09 DIAGNOSIS — R2689 Other abnormalities of gait and mobility: Secondary | ICD-10-CM

## 2023-01-09 DIAGNOSIS — R293 Abnormal posture: Secondary | ICD-10-CM

## 2023-01-09 DIAGNOSIS — M6281 Muscle weakness (generalized): Secondary | ICD-10-CM
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
## 2023-01-16 ENCOUNTER — Other Ambulatory Visit: Payer: Self-pay

## 2023-01-16 ENCOUNTER — Ambulatory Visit: Payer: BC Managed Care – PPO | Attending: Obstetrics and Gynecology

## 2023-01-16 DIAGNOSIS — R102 Pelvic and perineal pain: Secondary | ICD-10-CM | POA: Diagnosis present

## 2023-01-16 DIAGNOSIS — R293 Abnormal posture: Secondary | ICD-10-CM | POA: Diagnosis present

## 2023-01-16 DIAGNOSIS — M6281 Muscle weakness (generalized): Secondary | ICD-10-CM | POA: Insufficient documentation

## 2023-01-16 DIAGNOSIS — R2689 Other abnormalities of gait and mobility: Secondary | ICD-10-CM | POA: Insufficient documentation
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
## 2023-01-23 ENCOUNTER — Other Ambulatory Visit: Payer: Self-pay

## 2023-01-23 ENCOUNTER — Ambulatory Visit: Payer: BC Managed Care – PPO

## 2023-01-23 DIAGNOSIS — R102 Pelvic and perineal pain: Secondary | ICD-10-CM

## 2023-01-23 DIAGNOSIS — R293 Abnormal posture: Secondary | ICD-10-CM

## 2023-01-23 DIAGNOSIS — M6281 Muscle weakness (generalized): Secondary | ICD-10-CM

## 2023-01-23 DIAGNOSIS — R2689 Other abnormalities of gait and mobility: Secondary | ICD-10-CM
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
## 2023-01-30 ENCOUNTER — Ambulatory Visit: Payer: BC Managed Care – PPO

## 2023-02-06 ENCOUNTER — Ambulatory Visit: Payer: BC Managed Care – PPO

## 2023-02-13 ENCOUNTER — Ambulatory Visit: Payer: BC Managed Care – PPO | Attending: Obstetrics and Gynecology

## 2023-02-20 ENCOUNTER — Ambulatory Visit: Payer: BC Managed Care – PPO

## 2023-02-27 ENCOUNTER — Ambulatory Visit: Payer: BC Managed Care – PPO

## 2023-03-06 ENCOUNTER — Ambulatory Visit: Payer: BC Managed Care – PPO

## 2023-03-13 ENCOUNTER — Ambulatory Visit: Payer: BC Managed Care – PPO

## 2023-03-20 ENCOUNTER — Ambulatory Visit: Payer: BC Managed Care – PPO

## 2023-03-27 ENCOUNTER — Ambulatory Visit: Payer: BC Managed Care – PPO

## 2023-04-03 ENCOUNTER — Ambulatory Visit: Payer: BC Managed Care – PPO

## 2023-05-16 NOTE — L&D Delivery Note (Signed)
 Delivery Note  Date of delivery: 05/12/2024 Estimated Date of Delivery: 05/20/24 No LMP recorded. Patient is pregnant. EGA: [redacted]w[redacted]d  Delivery Note At 11:47 PM a viable female was delivered via Vaginal, Spontaneous (Presentation: Right Occiput Anterior).  APGAR: 8, 9; weight 2610g.  Placenta status: Spontaneous, Intact.  Cord: 3 vessels with the following complications: None.  Cord pH: n/a  First Stage: Labor onset: 1900 Augmentation : AROM Analgesia /Anesthesia intrapartum: None AROM at 2247  Kelly Ford presented to L&D with active labor. She was augmented with AROM.    Second Stage: Complete dilation at 2342 Onset of pushing at 2342 FHR second stage Cat I Delivery at 2347 on 05/12/2024  She progressed to complete and had a spontaneous vaginal birth of a live female over an intact perineum. The fetal head was delivered in OA position with restitution to ROA. Loose nuchal cord easily reduced over fetal head. Anterior then posterior shoulders delivered spontaneously with minimal assistance. Baby placed on mom's abdomen and attended to by transition RN. Cord clamped and cut after 1+ min by FOB.   Third Stage: Placenta delivered intact with 3VC at 2355 Placenta disposition: routine disposal Uterine tone firm / bleeding min IV pitocin  given for hemorrhage prophylaxis  Anesthesia: None Episiotomy: None Lacerations: None Suture Repair: n/a Est. Blood Loss (mL): 100  Complications: none  Mom to postpartum.  Baby to Couplet care / Skin to Skin.  Newborn: Birth Weight: 2610g  Apgar Scores: 8, 9 Feeding planned: Breastfeeding   Kelly Ford, CNM 05/12/2024 12:21 AM

## 2023-10-29 DIAGNOSIS — Z8659 Personal history of other mental and behavioral disorders: Secondary | ICD-10-CM | POA: Insufficient documentation

## 2023-10-29 NOTE — Progress Notes (Signed)
 History of Present Illness Kelly Ford is here to initiate prenatal care.  Patient's last menstrual period was 08/14/2023.Kelly Ford  She is a 28 y.o., female, [id number], at [redacted]w[redacted]d based on LMP, consistent with  early ultrasound, [redacted]w[redacted]d with an Estimated Date of Delivery: 05/20/24.  She experiences intermittent nausea and occasional diarrhea. The nausea is described as 'off and on,' with some days being worse than others, and it can last the entire day. She has not taken any medication for nausea as it is not consistently severe.   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  Patient questions or concerns: tolerating nausea  She is currently experiencing: nausea and fatigue She denies: bleeding, contractions, cramping or leaking  Ultrasound? yes US : 10/04/2023 Single, viable IUP seen  CRL = 9.75mm = [redacted]w[redacted]d,  c/w dates  EDC by US  05/24/2023 FHT: 148 bpm Cervical length: 3.90 cm Right ovary: WNL Left ovary: WNL  Planned pregnancy? no Desired pregnancy? yes Fertility Treatements? no  OB complications:  History of postpartum hemorrhage? no History of preeclampsia or gestational hypertension? no History of shoulder dystocia? no History of vacuum or forceps? no History of spontaneous PTL/PPROM? no History of gestational diabetes? no History of other complications: Yes  - History of IUGR with G1  Breastfeeding History Tool 1. How many babies have you breastfed? 2 2. Did you have any concerns or problems with breast feeding with breastfeeding? no Concerns: denies  3. Length of breast feeding with each child:  G1 (son) - 77 months  G2 (son) - 12 months   The following portions of the patient's history were reviewed and updated as appropriate: History reviewed in detail and updated:  Past Medical History:  has a past medical history of Anemia affecting pregnancy in third trimester (HHS-HCC) (12/29/2021), Anxiety  (2016), Benign joint hypermobility, Bilateral leg pain (04/09/2017), Depression (2016), Dysmenorrhea, Hemorrhoids during pregnancy in third trimester (HHS-HCC) (06/23/2019), History of recurrent UTIs, Infertility management, Joint pain, Mild scoliosis (02/02/2016), and Pes planus (02/02/2016).  Past Surgical History:   has a past surgical history that includes Tonsillectomy (09/28/2016) and egd (12/10/2019). Past Obstetric History:  OB History  Gravida Para Term Preterm AB Living  4 2 2  0 1 2  SAB IAB Ectopic Molar Multiple Live Births  1 0 0 0 0 2    # Outcome Date GA Lbr Len/2nd Weight Sex Type Anes PTL Lv  4 Current           3 Term 03/14/22 [redacted]w[redacted]d 07:05 / 00:09 2.6 kg (5 lb 11.7 oz) M Vag-Spont None  LIV     Birth Comments: No anomalies noted at delivery;      Name: Jonah     Apgar1: 7  Apgar5: 9  2 SAB 11/2020     SAB     1 Term 05/25/19 [redacted]w[redacted]d 04:48 / 00:11 2.69 kg (5 lb 14.9 oz) M Vag-Spont None  LIV     Name: NYANA, HAREN     Apgar1: 8  Apgar5: 9    Past Family History: family history includes Breast cancer in her maternal grandmother and mother; Diabetes in her paternal grandmother; Fibromyalgia in her mother; Rheum arthritis in her maternal grandfather. Past Social History:  reports that she has never smoked. She has never used smokeless tobacco. She reports that she does not drink alcohol and does not use drugs.  Allergies: has no known allergies.  Medications: Current  Outpatient Medications on File Prior to Visit  Medication Sig Dispense Refill  . prenatal vitamin with iron -folic acid (PRENATAL TABLETS) tablet Take 1 tablet by mouth once daily    . hydrocortisone 2.5 % lotion Apply topically 2 (two) times daily for 30 days (Patient not taking: Reported on 10/29/2023) 118 mL 0  . inulin/sorbitol (FIBER CHOICE ORAL) Take 1 Dose by mouth once daily as needed (Patient not taking: Reported on 10/29/2023)    . iron  bis-gly/FA/C/B12/Ca/succ (IRON -150 ORAL) Take 1 tablet by mouth  once daily (Patient not taking: Reported on 10/29/2023)    . ondansetron  (ZOFRAN ) 4 MG tablet Take 1 tablet (4 mg total) by mouth every 8 (eight) hours as needed for Nausea (Patient not taking: Reported on 10/04/2023) 30 tablet 0   No current facility-administered medications on file prior to visit.    GYNHx:  GYN Surgery: No Last pap: Sep 12, 2021 no abnormalities Abnormal Pap Hx: No --If abnormal in the past, history of LEEP/CKC: N/A History of sexually transmitted infections:  None  Social History: Name of partner: Camera operator    Patient's Occupation: Land Occupation: Patent examiner  History of abuse (sexual, emotional, physical): no History of anxiety/depression: no Any social issues not addressed above? no  Infectious Screening: Risk factors for TB? no Varicella infection in the past or immunization: yes Cats in home: yes Zika virus exposure:  no Flu vaccine: accepted Covid-19 vaccine: Reviewed options, patient: declines History of blood transfusion: no  Genetic Risk Assessment: Family or personal history of sickle cell anemia or trait:  no Family genetic syndromes (tay sachs, fragile x, CF, muscular dystrophy, huntington chorea, etc):  no Familial cardiac defects:  no Family history of mental retardation or autism:  no Maternal metabolic syndrome: no  Planning/Education: Willing to accept a blood transfusion if her life depended on it? yes Have you received Rhogam with a past pregnancy or during this pregnancy? no Patient informed that HIV testing will be performed during her prenatal care and agrees to testing: yes  ASA Therapy in Pregnancy:   Low-dose aspirin (81mg  per day) started between 12 and 28 weeks of gestation  is used to reduce the occurrence of preeclampsia, preterm birth, and intrauterine growth restriction in women at increased risk of preeclampsia.   Risk factors in this pregnancy:  Moderate risk factor (2 or more): None High risk factor (1  or more): None  Low dose aspirin not indicated.   MyChart: MyChart introduced and encouraged for non-urgent concerns.  Patient Active Problem List  Diagnosis  . Sensory disturbance  . Encounter for supervision of other normal pregnancy in third trimester (HHS-HCC)  . History of intrauterine growth restriction in prior pregnancy, currently pregnant, third trimester (HHS-HCC)  . Anemia affecting pregnancy in third trimester (HHS-HCC)  . History of postpartum depression, currently pregnant (HHS-HCC)    ROS: Pertinent positives and negatives in the HPI, otherwise a 10 system ROS was negative.  EXAM: VITALS:  Vitals:   10/29/23 1050  BP: 96/66  Pulse: 92   Body mass index is 20.7 kg/m.  PE Chaperone note: A chaperone was included for sensitive portions of the exam- staff member name: Glenys Alstrom, CMA    Chaperone present for pelvic exam.    Physical Exam Constitutional:      Appearance: Normal appearance.  Genitourinary:     Vulva normal.     Uterus is enlarged (c/w [redacted] week gestation).  Breasts:    Right: Normal.     Left:  Normal.   Cardiovascular:     Rate and Rhythm: Normal rate.     Pulses: Normal pulses.  Pulmonary:     Effort: Pulmonary effort is normal.  Abdominal:     Palpations: Abdomen is soft.     Tenderness: There is no abdominal tenderness.   Musculoskeletal:        General: Normal range of motion.     Cervical back: Normal range of motion.   Neurological:     Mental Status: She is alert and oriented to person, place, and time.   Skin:    General: Skin is warm and dry.     Capillary Refill: Capillary refill takes less than 2 seconds.   Psychiatric:        Mood and Affect: Mood normal.  Exam conducted with a chaperone present.      Doppler: 171 bpm distinguished from maternal pulse   Assessment:   28 y.o. [id number] @ at [redacted]w[redacted]d with   Assessment & Plan 1. Encounter for supervision of other normal pregnancy, first trimester (HHS-HCC) - Advise  small, frequent meals to manage nausea. - Schedule follow-up appointments every four weeks, then every two weeks at 28 weeks, and weekly at 36 weeks. -     CBC/D/Plt+RPR+Rh+ABO+RubIgG... - [facility] -     Hgb Fractionation Cascade - [facility] -     Rapid HIV -     Inheritest(R) CF/SMA Panel - [facility] -     MaterniT21 PLUS Core+SCA - [facility] -     Urinalysis w/Microscopic -     Urine Culture, Prenatal, w/GBS - [facility] -     Xpert CT/NG, PCR - [facility]  2. History of intrauterine growth restriction in prior pregnancy, currently pregnant, third trimester (HHS-HCC) IUGR with G1 - baby weight 2.69 kg at birth. 2nd baby weighed 2.6 kg at birth.  -No complications -Plan for growth US  34-36 weeks  3. History of postpartum depression, currently pregnant (HHS-HCC) No current medications   4. Routine screening for STI (sexually transmitted infection) -     Xpert CT/NG, PCR - [facility]  5. Screening for diabetes mellitus -     Hemoglobin A1C  6. Screening for thyroid disorder -     Thyroid Stimulating-Hormone (TSH)   Supervision of pregnancy: Standard OB labs ordered, including HIV, GC/CT, and urine cultures. Verbal consent obtained for HIV testing. Pap up to date as of 09/12/2021 . Reviewed nutrition, diet, and exercise in pregnancy. Healthy diet and lifestyle choices encouraged.  Reviewed dietary instructions and restrictions: increased protein, fruits and vegetables daily and ample water intake; avoiding large predator fish, unpasturized dairy products and any recalled products to avoid listeriosis, raw meat, raw fish, and raw shellfish. Discussed risks of Zika has not been to [place] region, toxoplasmosis does have indoor cats/does not scoop cat litter.  Reviewed prenatal vitamin - one everyday. Precautions, limitations, and expectations reviewed Reviewed normal pregnancy discomforts and comfort measures  OTC meds reviewed  Pregnancy BMI: Body mass index is 20.7 kg/m. Recommended weight gain  discussed. IOM/WHO weight gain recommendations for pregnancy 2009 Singleton pregnancy -- The current recommendations for singleton pregnancy are: Body mass index is 20.7 kg/m.  TWG: 1.814 kg (4 lb)  Expected: 11.5 kg (25 lb)-16 kg (35 lb)   Genetic testing and screening: Discussed options for genetic screening. Limitations of testing reviewed.   Discussed cfDNA screen at 10 and greater to test for aneuploidy. Patient desires this screening.  Discussed CF and SMA carrier screening for individuals who have not  completed this screening prior. She desires this option  this screening. Discussed AFP/quad screen between 15 & 21 weeks. Patient is considering this option Discussed the Anatomical Ultrasound to be done by 20 weeks here.  Oriented to [facility] OB/GYN practice model:  I have reviewed the complimentary nature of our practice which incorporates diverse care providers including certified nurse midwives and physicians. I have reviewed that we each provide a unique perspective on pregnancy and birth experience that will enhance their experience and encourage that patient/couple seek to see all of our highly qualified providers. I have reviewed that most patients will have approximately 10-12 visits and an opportunity to meet all members of their care team. Shared call  between providers and [facility] site of delivery were also discussed as well as resources for childbirth and breastfeeding education at the [facility]. [facility] OBGYN Prenatal Booklet was also reviewed with the patient.   Call for any bleeding, cramping or worsening of symptoms.  Return in about 4 weeks (around 11/26/2023) for routine [facility] with Dr. Leonce .  A total of 30 minutes were spent face to face with the patient, the majority of the time was spent on counseling and coordination of care.   Attestation Statement:   I personally performed the service, non-incident to. (WP)   ANNA ROSALINE PILLOW,  CNM

## 2023-11-27 NOTE — Progress Notes (Signed)
 Routine Prenatal Care Visit  Subjective  Kelly Ford is a 28 y.o. [phone] at [redacted]w[redacted]d being seen today for ongoing prenatal care.  She is currently monitored for tthis high-risk pregnancy.  ----------------------------------------------------------------------------------- Patient reports some diarrhea.    .  .  Movement: Absent. Leaking Fluid denies.  Vaginal Bleeding: denies ----------------------------------------------------------------------------------- The following portions of the patient's history were reviewed and updated as appropriate: allergies, current medications, past family history, past medical history, past social history, past surgical history and problem list. Problem list updated.  Objective  BP 95/65   Pulse 104   Ht 152.4 cm (5')   Wt 49.4 kg (109 lb)   LMP 08/14/2023 (Exact Date)   BMI 21.29 kg/m   Pregravid weight 46.3 kg (102 lb) Total Weight Gain 3.175 kg (7 lb) Urinalysis: Urine Protein    Urine Glucose    Fetal Status: Fetal Heart Rate: 145   Movement: Absent     General:  Alert, oriented and cooperative. Patient is in no acute distress.  Skin: Skin is warm and dry. No rash noted.   Cardiovascular: Normal heart rate noted  Respiratory: Normal respiratory effort, no problems with respiration noted  Abdomen: Soft, gravid, appropriate for gestational age.       Pelvic:  Cervical exam deferred        Extremities: Normal range of motion.     Mental Status: Normal mood and affect. Normal behavior. Normal judgment and thought content.   Assessment   28 y.o. [id number] at [redacted]w[redacted]d by  05/20/2024, by Last Menstrual Period presenting for routine prenatal visit  Plan   Preterm labor symptoms and general obstetric precautions including but not limited to vaginal bleeding, contractions, leaking of fluid and fetal movement were reviewed in detail with the patient. Please refer to After Visit Summary for other counseling recommendations.   Return in about 4 weeks  (around 12/25/2023) for Anatomy u/s and routine prenatal.   Attestation Statement:   I personally performed the service. (TP)  STEPHEN TORIBIO MACE, MD  [facility] OB/GYN [facility] 11/27/2023 10:33 AM

## 2023-12-19 NOTE — Progress Notes (Signed)
 Patient's last menstrual period was 08/14/2023 (exact date). Estimated Date of Delivery: 05/20/24  28 y.o. [id number] at [redacted]w[redacted]d The primary encounter diagnosis was Supervision of high risk pregnancy in second trimester (HHS-HCC). Diagnoses of History of intrauterine growth restriction in prior pregnancy, currently pregnant, third trimester (HHS-HCC) and History of postpartum depression, currently pregnant (HHS-HCC) were also pertinent to this visit.  S:   Patient concerns today:  - none  Chief Complaint  Patient presents with  . Routine Prenatal Visit    Reports: no problems  Denies bleeding, contractions, cramping or leaking.   O:   See [facility] flowsheets. BP 94/65   Pulse (!) 111   Ht 152.4 cm (5')   Wt 51.2 kg (112 lb 12.8 oz)   LMP 08/14/2023 (Exact Date)   BMI 22.03 kg/m  Gen: NAD  Pulm: No use of accessory muscles, normal respirations Abdomen: Gravid, nontender Pelvic: SVE deferred Ext : no edema, no rashes Psych: Mood, insight, judgement intact  Ultrasound Anatomy: normal Presentation: breech EFW: 256g = 32% FHR: 144bpm Previa: none Placenta: posterior AFI: normal Adnexa: normal CL: 4.7cm  A/P:  28 y.o. [id number] at [redacted]w[redacted]d  History of FGR Growth US  34-36 weeks  History of Postpartum Depression  No meds   - Problem list reviewed and/or updated. - Reviewed anatomy scan results - Return in about 4 weeks (around 01/22/2024) for Routine OB visit.    Attestation Statement:   I personally performed the service, non-incident to. [facility])   JENNIFER RICHARDSON MYRON DELON MYRON, CNM 12/25/2023 10:24 AM

## 2024-01-14 ENCOUNTER — Other Ambulatory Visit: Payer: Self-pay

## 2024-01-14 ENCOUNTER — Emergency Department
Admission: EM | Admit: 2024-01-14 | Discharge: 2024-01-14 | Disposition: A | Discharge status: Home / Self Care | Payer: Self-pay | Attending: Emergency Medicine | Admitting: Emergency Medicine

## 2024-01-14 DIAGNOSIS — O99891 Other specified diseases and conditions complicating pregnancy: Secondary | ICD-10-CM

## 2024-01-14 DIAGNOSIS — R8271 Bacteriuria: Secondary | ICD-10-CM | POA: Diagnosis not present

## 2024-01-14 DIAGNOSIS — Z3A21 21 weeks gestation of pregnancy: Secondary | ICD-10-CM | POA: Diagnosis not present

## 2024-01-14 DIAGNOSIS — R55 Syncope and collapse: Secondary | ICD-10-CM | POA: Insufficient documentation

## 2024-01-14 DIAGNOSIS — O2392 Unspecified genitourinary tract infection in pregnancy, second trimester: Secondary | ICD-10-CM | POA: Insufficient documentation

## 2024-01-14 LAB — COMPREHENSIVE METABOLIC PANEL WITH GFR
ALT: 10 U/L (ref 0–44)
AST: 13 U/L — ABNORMAL LOW (ref 15–41)
Albumin: 3.4 g/dL — ABNORMAL LOW (ref 3.5–5.0)
Alkaline Phosphatase: 39 U/L (ref 38–126)
Anion gap: 6 (ref 5–15)
BUN: 14 mg/dL (ref 6–20)
CO2: 26 mmol/L (ref 22–32)
Calcium: 8.9 mg/dL (ref 8.9–10.3)
Chloride: 103 mmol/L (ref 98–111)
Creatinine, Ser: 0.46 mg/dL (ref 0.44–1.00)
GFR, Estimated: >60 mL/min (ref >60)
Glucose, Bld: 99 mg/dL (ref 70–99)
Potassium: 3.3 mmol/L — ABNORMAL LOW (ref 3.5–5.1)
Sodium: 135 mmol/L (ref 135–145)
Total Bilirubin: 0.4 mg/dL (ref 0.0–1.2)
Total Protein: 6.9 g/dL (ref 6.5–8.1)

## 2024-01-14 LAB — CBC WITH DIFFERENTIAL/PLATELET
Abs Immature Granulocytes: 0.03 K/uL (ref 0.00–0.07)
Basophils Absolute: 0 K/uL (ref 0.0–0.1)
Basophils Relative: 0 %
Eosinophils Absolute: 0.1 K/uL (ref 0.0–0.5)
Eosinophils Relative: 1 %
HCT: 37.4 % (ref 36.0–46.0)
Hemoglobin: 12.2 g/dL (ref 12.0–15.0)
Immature Granulocytes: 0 %
Lymphocytes Relative: 23 %
Lymphs Abs: 2.1 K/uL (ref 0.7–4.0)
MCH: 29.4 pg (ref 26.0–34.0)
MCHC: 32.6 g/dL (ref 30.0–36.0)
MCV: 90.1 fL (ref 80.0–100.0)
Monocytes Absolute: 0.6 K/uL (ref 0.1–1.0)
Monocytes Relative: 6 %
Neutro Abs: 6.2 K/uL (ref 1.7–7.7)
Neutrophils Relative %: 70 %
Platelets: 245 K/uL (ref 150–400)
RBC: 4.15 MIL/uL (ref 3.87–5.11)
RDW: 13.5 % (ref 11.5–15.5)
WBC: 8.9 K/uL (ref 4.0–10.5)
nRBC: 0 % (ref 0.0–0.2)

## 2024-01-14 LAB — URINALYSIS, ROUTINE W REFLEX MICROSCOPIC
Bilirubin Urine: NEGATIVE
Glucose, UA: NEGATIVE mg/dL
Hgb urine dipstick: NEGATIVE
Ketones, ur: NEGATIVE mg/dL
Nitrite: NEGATIVE
Protein, ur: NEGATIVE mg/dL
Specific Gravity, Urine: 1.018 (ref 1.005–1.030)
pH: 5 (ref 5.0–8.0)

## 2024-01-14 LAB — POC URINE PREG, ED: Preg Test, Ur: POSITIVE — AB

## 2024-01-14 MED ORDER — SODIUM CHLORIDE 0.9 % IV BOLUS
1000.0000 mL | Freq: Once | INTRAVENOUS | Status: AC
  Administered 2024-01-14: 1000 mL via INTRAVENOUS

## 2024-01-14 MED ORDER — CEPHALEXIN 500 MG PO CAPS: 500.0000 mg | ORAL_CAPSULE | Freq: Four times a day (QID) | ORAL | 0 refills | Status: AC

## 2024-01-14 NOTE — ED Provider Notes (Signed)
 [facility] Provider Note    Event Date/Time   First MD Initiated Contact with Patient 01/14/24 [phone]     (approximate)   History   Loss of Consciousness   HPI  Kelly Ford is a 28 y.o. female G4 P2-0-1-2 currently at 21 weeks and 6 days, who presents today for evaluation of syncope.  Patient reports that she went back to work for the first time today in several months, and when she was walking into a patient's room she felt very lightheaded, and fainted.  She reports that she was caught by a nurse that was working with her so she did not strike her head or her belly.  She is unsure how long she was unconscious but thinks it was just a few seconds.  She reports that she ate breakfast this morning.  She reports that she has a history of low blood pressure and borderline anemia for which she takes iron  pills.  She has not had any complications for this pregnancy.  She has not had any vaginal discharge or bleeding.  She has not had any gush of fluids.  She denies having any abdominal pain or cramping.  She is still feeling normal fetal movement.  She reports that she has syncopized before.  Patient Active Problem List   Diagnosis Date Noted   Normal labor 03/14/2022   Uterine contractions during pregnancy 03/13/2022   Anemia affecting pregnancy in third trimester 12/29/2021   History of intrauterine growth restriction in prior pregnancy, currently pregnant, third trimester 09/12/2021   Supervision of normal pregnancy 08/04/2021   Acid reflux 06/21/2015          Physical Exam   Triage Vital Signs: ED Triage Vitals [01/14/24 0743]  Encounter Vitals Group     BP 105/70     Girls Systolic BP Percentile      Girls Diastolic BP Percentile      Boys Systolic BP Percentile      Boys Diastolic BP Percentile      Pulse Rate 80     Resp 18     Temp 97.9 F (36.6 C)     Temp Source Oral     SpO2 98 %     Weight      Height      Head  Circumference      Peak Flow      Pain Score 0     Pain Loc      Pain Education      Exclude from Growth Chart     Most recent vital signs: Vitals:   01/14/24 0743  BP: 105/70  Pulse: 80  Resp: 18  Temp: 97.9 F (36.6 C)  SpO2: 98%    Physical Exam Vitals and nursing note reviewed.  Constitutional:      General: Awake and alert. No acute distress.    Appearance: Normal appearance. The patient is normal weight.  HENT:     Head: Normocephalic and atraumatic.     Mouth: Mucous membranes are moist.  Eyes:     General: PERRL. Normal EOMs        Right eye: No discharge.        Left eye: No discharge.     Conjunctiva/sclera: Conjunctivae normal.  Cardiovascular:     Rate and Rhythm: Normal rate and regular rhythm.     Pulses: Normal pulses.  Pulmonary:     Effort: Pulmonary effort is normal. No respiratory distress.  Breath sounds: Normal breath sounds.  Abdominal:     Abdomen is soft. There is no abdominal tenderness. No rebound or guarding. No distention. Musculoskeletal:        General: No swelling. Normal range of motion.     Cervical back: Normal range of motion and neck supple.  Skin:    General: Skin is warm and dry.     Capillary Refill: Capillary refill takes less than 2 seconds.     Findings: No rash.  Neurological:     Mental Status: The patient is awake and alert.      ED Results / Procedures / Treatments   Labs (all labs ordered are listed, but only abnormal results are displayed) Labs Reviewed  COMPREHENSIVE METABOLIC PANEL WITH GFR - Abnormal; Notable for the following components:      Result Value   Potassium 3.3 (*)    Albumin 3.4 (*)    AST 13 (*)    All other components within normal limits  URINALYSIS, ROUTINE W REFLEX MICROSCOPIC - Abnormal; Notable for the following components:   Color, Urine YELLOW (*)    APPearance CLOUDY (*)    Leukocytes,Ua MODERATE (*)    Bacteria, UA MANY (*)    All other components within normal limits  POC  URINE PREG, ED - Abnormal; Notable for the following components:   Preg Test, Ur Positive (*)    All other components within normal limits  CBC WITH DIFFERENTIAL/PLATELET  CBG MONITORING, ED     EKG     RADIOLOGY     PROCEDURES:  Critical Care performed:   Procedures   MEDICATIONS ORDERED IN ED: Medications  sodium chloride  0.9 % bolus 1,000 mL (0 mLs Intravenous Stopped 01/14/24 0910)     IMPRESSION / MDM / ASSESSMENT AND PLAN / ED COURSE  I reviewed the triage vital signs and the nursing notes.   Differential diagnosis includes, but is not limited to, physiologic fluid shifts, arrhythmia, urinary tract infection, anemia, dehydration, vasovagal syndrome.  Patient is awake and alert.  Upstairs her blood pressure was reportedly 92/59 with a blood sugar of 106.  Here her blood pressure is 105/70.  No tachycardia or hypoxia, no chest pain or shortness of breath, no clinical signs or symptoms of DVT, no history of PE or DVT, I do not suspect pulmonary embolism today.  I personally reviewed the EKG which was obtained upon arrival, and it is negative for any acute ischemic signs or changes or arrhythmias.  Also reviewed by attending MD, Dr. Arlander.  Patient has normal fetal heart tones.  She has no pregnancy complaints.  She has no gush of fluids, contractions, vaginal discharge or bleeding.  Do not feel ultrasound is indicated today.  Patient is in agreement.  She has close follow-up with her OB/GYN.  She has a documented IUP.  Patient was hydrated with a liter of normal saline with significant improvement of her symptoms.  Other possibilities for her syncope include fluid shifts in pregnancy, pressure on large vessels from growing belly, vasovagal syndrome.  Her urinalysis does reveal bacteriuria.  Will treat asymptomatic bacteriuria in pregnancy which I explained to the patient.  She has no concern for STDs does not wish to be tested for STDs.  We discussed the importance of  close outpatient follow-up as return precautions.  Patient understands and agrees with plan.  She was discharged in stable condition.   Patient's presentation is most consistent with acute presentation with potential threat to life or  bodily function.   Clinical Course as of 01/14/24 1050  Mon Jan 14, 2024  0907 FHT 156 [JP]  0910 Patient reports feeling improved [JP]    Clinical Course User Index [JP] Jermari Tamargo E, PA-C     FINAL CLINICAL IMPRESSION(S) / ED DIAGNOSES   Final diagnoses:  Syncope and collapse  Bacteriuria in pregnancy     Rx / DC Orders   ED Discharge Orders          Ordered    cephALEXin  (KEFLEX ) 500 MG capsule  4 times daily        01/14/24 9076             Note:  This document was prepared using Dragon voice recognition software and may include unintentional dictation errors.   Rameen Gohlke E, PA-C 01/14/24 1050    Arlander [place], MD 01/14/24 1051

## 2024-01-14 NOTE — Discharge Instructions (Addendum)
 Please follow-up with your OB/GYN.  Please return for any new, worsening, or change in symptoms or other concerns.  It was a pleasure caring for you today.

## 2024-01-14 NOTE — ED Triage Notes (Addendum)
 Pt to ED from work upstairs. Pt has witnessed syncopal episode. Pt reports was feeling light headed. Pt states has been eating and drinking normal. No pregnancy complaints. Pt is G4P2. This will be worker's comp  CBG upstairs 106 BP 92/59

## 2024-01-14 NOTE — ED Notes (Signed)
 See triage note  Presents s/p syncopal episode  States was upstairs working  Then had a witnessed syncopal episode  States was light headed

## 2024-02-11 DIAGNOSIS — Z3482 Encounter for supervision of other normal pregnancy, second trimester: Secondary | ICD-10-CM | POA: Diagnosis not present

## 2024-02-11 DIAGNOSIS — Z3483 Encounter for supervision of other normal pregnancy, third trimester: Secondary | ICD-10-CM | POA: Diagnosis not present

## 2024-02-18 DIAGNOSIS — Z23 Encounter for immunization: Secondary | ICD-10-CM | POA: Diagnosis not present

## 2024-02-18 DIAGNOSIS — O0993 Supervision of high risk pregnancy, unspecified, third trimester: Secondary | ICD-10-CM | POA: Diagnosis not present

## 2024-03-03 DIAGNOSIS — O26843 Uterine size-date discrepancy, third trimester: Secondary | ICD-10-CM | POA: Diagnosis not present

## 2024-03-19 DIAGNOSIS — E559 Vitamin D deficiency, unspecified: Secondary | ICD-10-CM | POA: Insufficient documentation

## 2024-03-19 DIAGNOSIS — E039 Hypothyroidism, unspecified: Secondary | ICD-10-CM | POA: Insufficient documentation

## 2024-03-31 DIAGNOSIS — O09293 Supervision of pregnancy with other poor reproductive or obstetric history, third trimester: Secondary | ICD-10-CM | POA: Diagnosis not present

## 2024-03-31 DIAGNOSIS — O0990 Supervision of high risk pregnancy, unspecified, unspecified trimester: Secondary | ICD-10-CM | POA: Diagnosis not present

## 2024-03-31 DIAGNOSIS — Z3A32 32 weeks gestation of pregnancy: Secondary | ICD-10-CM | POA: Diagnosis not present

## 2024-03-31 DIAGNOSIS — Z23 Encounter for immunization: Secondary | ICD-10-CM | POA: Diagnosis not present

## 2024-04-24 ENCOUNTER — Observation Stay
Admission: EM | Admit: 2024-04-24 | Discharge: 2024-04-24 | Disposition: A | Discharge status: Home / Self Care | Attending: Obstetrics and Gynecology | Admitting: Obstetrics and Gynecology

## 2024-04-24 DIAGNOSIS — Z3A36 36 weeks gestation of pregnancy: Secondary | ICD-10-CM | POA: Diagnosis not present

## 2024-04-24 DIAGNOSIS — O479 False labor, unspecified: Secondary | ICD-10-CM | POA: Diagnosis present

## 2024-04-24 HISTORY — DX: Hypothyroidism, unspecified: E03.9

## 2024-04-24 HISTORY — DX: Anemia, unspecified: D64.9

## 2024-04-24 NOTE — OB Triage Note (Signed)
 Pt is a 28yo [id number], 36w 2d. She arrived to the unit with complaints of contractions for the past 2 nights and vaginal pressure that has been constant for at least 2 weeks. 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 She denies vaginal bleeding, and reports positive fetal movement.  VS stable, monitors applied and assessing. Initial FHT 135 at 0735.  CNM aware of pt arrival, plan to SVE.

## 2024-04-24 NOTE — OB Triage Note (Signed)
 28 y.o G4P2 presents to L&D at [redacted]w[redacted]d for pelvic and abdominal pain. Pt reports ctx's have improved since being in L&D and denies vaginal bleeding or decreased fetal movement. Cervical check was 1.5/Thick/-1. Pt being discharged by [facility]. Labor precautions reviewed and pt verbalized understanding. Abdominal binder given to pt. Going home stable and ambulatory.

## 2024-04-24 NOTE — Discharge Summary (Signed)
 Patient ID: Kelly Ford MRN: [id number] DOB/AGE: [birth year] 28 y.o.  Admit date: 04/24/2024 Discharge date: 04/24/2024  Admission Diagnoses: 28 y.o. H5E7987 at [redacted]w[redacted]d present with complaints of contractions for the past 2 nights and vaginal pressure that has been constant for at least 2 weeks. 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 She denies vaginal bleeding, and reports positive fetal movement.   Discharge Diagnoses:  Labor: not present.  Fetal Wellbeing: NST is Reassuring Cat 1 tracing. D/c home stable, precautions reviewed, follow-up as scheduled.   Factors complicating pregnancy: Lightheadedness/ Syncopal episode History of FGR Iron  deficiency Vitamin D deficiency History of Postpartum Depression   Prenatal Procedures: NST  Consults: None  Significant Diagnostic Studies:  No results found for this or any previous visit (from the past week).  Treatments: none  Hospital Course:  This is a 28 y.o. H5E7987 with IUP at [redacted]w[redacted]d admitted for a labor check.  Initial SVE 1.5/Thick/-1.  Patient was reassured by the exam and requested to go home.  She was observed for an hour, fetal heart rate monitoring remained reassuring, and she had no signs/symptoms of progressing preterm labor or other maternal-fetal concerns.   She was deemed stable for discharge to home with outpatient follow up.  Discharge Physical Exam:  BP 102/65   Pulse 72   Temp 97.7 F (36.5 C) (Oral)   Ht 5' 1 (1.549 m)   Wt 57.2 kg   BMI 23.81 kg/m   General: NAD CV: RRR Pulm: nl effort ABD: s/nd/nt, gravid DVT Evaluation: LE non-ttp, no evidence of DVT on exam.  NST: FHR baseline: 130 bpm Variability: moderate Accelerations: yes Decelerations: none Category/reactivity: reactive   TOCO: quiet SVE:  Dilation: 1.5 Effacement (%): Thick Cervical Position: Middle Station: -1 Presentation:  Vertex Exam by:: A Franks RN   Discharge Condition: Stable  Disposition: Discharge disposition: 01-Home or Self Care        Allergies as of 04/24/2024   No Known Allergies      Medication List     STOP taking these medications    ascorbic acid  1000 MG tablet Commonly known as: VITAMIN C   Biotin 10 MG Caps   ibuprofen  600 MG tablet Commonly known as: ADVIL    Lysine HCl 1000 MG Tabs   Melatonin 1 MG Caps   Vitamin D3 10 MCG (400 UNIT) Caps   Vitamin E 100 units Tabs       TAKE these medications    acetaminophen  500 MG tablet Commonly known as: TYLENOL  Take 2 tablets (1,000 mg total) by mouth every 6 (six) hours.   doxylamine (Sleep) 25 MG tablet Commonly known as: UNISOM Take 25 mg by mouth at bedtime as needed.   ferrous sulfate  325 (65 FE) MG tablet Take 325 mg by mouth daily with breakfast.   levothyroxine 75 MCG tablet Commonly known as: SYNTHROID Take 50 mcg by mouth daily before breakfast.   loperamide 2 MG capsule Commonly known as: IMODIUM Take 2 mg by mouth as needed for diarrhea or loose stools.   ondansetron  4 MG/5ML solution Commonly known as: ZOFRAN  Take 8 mg by mouth once.   prenatal multivitamin Tabs tablet Take 1 tablet by mouth daily at 12 noon.        Follow-up Information     [facility] Follow up.   Why: Keep all scheduled appointments Contact information: [address]   [place]  [address] [phone]                Signed:  DELON COE, CNM 04/24/2024 8:32 AM

## 2024-05-03 ENCOUNTER — Other Ambulatory Visit: Payer: Self-pay

## 2024-05-03 ENCOUNTER — Encounter: Payer: Self-pay | Admitting: Obstetrics and Gynecology

## 2024-05-03 ENCOUNTER — Observation Stay
Admission: EM | Admit: 2024-05-03 | Discharge: 2024-05-03 | Disposition: A | Discharge status: Home / Self Care | Attending: Obstetrics and Gynecology | Admitting: Obstetrics and Gynecology

## 2024-05-03 DIAGNOSIS — Z3A37 37 weeks gestation of pregnancy: Secondary | ICD-10-CM | POA: Insufficient documentation

## 2024-05-03 DIAGNOSIS — O471 False labor at or after 37 completed weeks of gestation: Secondary | ICD-10-CM | POA: Diagnosis present

## 2024-05-03 DIAGNOSIS — O26899 Other specified pregnancy related conditions, unspecified trimester: Principal | ICD-10-CM | POA: Diagnosis present

## 2024-05-03 DIAGNOSIS — R102 Pelvic and perineal pain unspecified side: Principal | ICD-10-CM | POA: Diagnosis present

## 2024-05-03 NOTE — OB Triage Note (Signed)
 Patient is a 28 yo, G4P2, at 37 weeks and 4 days. Patient presents with complaints of contractions.  Pt reports her contractions started around midnight today and were every couple minutes but calmed down when she was on her way to L&D. She reports that she feels even more pressure when she stands up. Patient denies any vaginal bleeding or LOF. Monitors applied and assessing. VSS. Initial fetal heart tone is 135. J Oxley CNM notified of patients arrival to unit. Plan to do a 2hr labor eval

## 2024-05-03 NOTE — Discharge Instructions (Signed)
 Maternity support belt, taking a bath, avoid staying on your feet

## 2024-05-03 NOTE — Discharge Summary (Signed)
 Patient ID: Kelly Ford MRN: [id number] DOB/AGE: 1995-12-12 28 y.o.  Admit date: 05/03/2024 Discharge date: 05/03/2024  Admission Diagnoses: 28 y.o. H5E7987 at [redacted]w[redacted]d present pelvic pressure.  Discharge Diagnoses:  Labor: not present.  Fetal Wellbeing: NST is Reassuring Cat 1 tracing. D/c home stable, precautions reviewed, follow-up as scheduled.   Factors complicating pregnancy: Lightheadedness/ Syncopal episode History of FGR Hypothyroidism- dx on 03/18/24 Iron  deficiency Vitamin D deficiency History of Postpartum Depression   Prenatal Procedures: NST   Consults: None  Significant Diagnostic Studies:  No results found for this or any previous visit (from the past week).  Treatments: none  Hospital Course:  This is a 28 y.o. H5E7987 with IUP at [redacted]w[redacted]d seen for pelvic pressure, worried she might be in labor.  Her initial SVE was  1.5/50/-1. She was observed for 2 hours, fetal heart rate monitoring remained reassuring, and she had no signs/symptoms of progressing labor or other maternal-fetal concerns.  Her cervical exam was unchanged from admission.  She was deemed stable for discharge to home with outpatient follow up.  Discharge Physical Exam:  BP 92/61 (BP Location: Right Arm)   Pulse 73   Temp 97.8 F (36.6 C) (Oral)   Resp 18   NST: FHR baseline: 130 bpm Variability: moderate Accelerations: yes Decelerations: none Time: 120 minutes Category/reactivity: reactive   TOCO: occasional SVE:  Dilation: 1.5 Effacement (%): 50 Station: -1 Exam by:: Charlyne Punch RN   Discharge Condition: Stable  Disposition: Discharge disposition: 01-Home or Self Care        Allergies as of 05/03/2024   No Known Allergies      Medication List     TAKE these medications    acetaminophen  500 MG tablet Commonly known as: TYLENOL  Take 2 tablets (1,000 mg total) by mouth every 6 (six) hours.   doxylamine (Sleep) 25 MG tablet Commonly known as:  UNISOM Take 25 mg by mouth at bedtime as needed.   ferrous sulfate  325 (65 FE) MG tablet Take 325 mg by mouth daily with breakfast.   levothyroxine 75 MCG tablet Commonly known as: SYNTHROID Take 50 mcg by mouth daily before breakfast.   loperamide 2 MG capsule Commonly known as: IMODIUM Take 2 mg by mouth as needed for diarrhea or loose stools.   ondansetron  4 MG/5ML solution Commonly known as: ZOFRAN  Take 8 mg by mouth once.   prenatal multivitamin Tabs tablet Take 1 tablet by mouth daily at 12 noon.        Follow-up Information     [facility] Follow up.   Why: Keep all scheduled appointments Contact information: [address] [place]  [address] [phone]                Signed:  DELON COE, CNM 05/03/2024 4:53 AM

## 2024-05-03 NOTE — Progress Notes (Signed)
 Discharge instructions provided to patient. Patient verbalized understanding. Pt educated on signs and symptoms of labor, vaginal bleeding, LOF, and fetal movement. Red flag signs reviewed by RN. Patient discharged home with significant other in stable condition.

## 2024-05-10 ENCOUNTER — Observation Stay
Admission: EM | Admit: 2024-05-10 | Discharge: 2024-05-10 | Disposition: A | Discharge status: Home / Self Care | Attending: Certified Nurse Midwife | Admitting: Obstetrics and Gynecology

## 2024-05-10 ENCOUNTER — Other Ambulatory Visit: Payer: Self-pay

## 2024-05-10 ENCOUNTER — Encounter: Payer: Self-pay | Admitting: Obstetrics and Gynecology

## 2024-05-10 DIAGNOSIS — O99013 Anemia complicating pregnancy, third trimester: Secondary | ICD-10-CM | POA: Insufficient documentation

## 2024-05-10 DIAGNOSIS — O479 False labor, unspecified: Principal | ICD-10-CM | POA: Diagnosis present

## 2024-05-10 DIAGNOSIS — E039 Hypothyroidism, unspecified: Secondary | ICD-10-CM | POA: Insufficient documentation

## 2024-05-10 DIAGNOSIS — Z3A38 38 weeks gestation of pregnancy: Secondary | ICD-10-CM | POA: Insufficient documentation

## 2024-05-10 DIAGNOSIS — D649 Anemia, unspecified: Secondary | ICD-10-CM | POA: Insufficient documentation

## 2024-05-10 DIAGNOSIS — E559 Vitamin D deficiency, unspecified: Secondary | ICD-10-CM | POA: Insufficient documentation

## 2024-05-10 DIAGNOSIS — Z8759 Personal history of other complications of pregnancy, childbirth and the puerperium: Secondary | ICD-10-CM | POA: Insufficient documentation

## 2024-05-10 DIAGNOSIS — O2653 Maternal hypotension syndrome, third trimester: Secondary | ICD-10-CM | POA: Insufficient documentation

## 2024-05-10 DIAGNOSIS — O4703 False labor before 37 completed weeks of gestation, third trimester: Principal | ICD-10-CM | POA: Insufficient documentation

## 2024-05-10 MED ORDER — ZOLPIDEM TARTRATE 5 MG PO TABS: 5.0000 mg | ORAL_TABLET | Freq: Every evening | ORAL | Status: DC | PRN

## 2024-05-10 MED ORDER — ACETAMINOPHEN 325 MG PO TABS: 650.0000 mg | ORAL_TABLET | ORAL | Status: DC | PRN

## 2024-05-10 MED ORDER — CALCIUM CARBONATE ANTACID 500 MG PO CHEW: 2.0000 | CHEWABLE_TABLET | ORAL | Status: DC | PRN

## 2024-05-10 MED ORDER — DOCUSATE SODIUM 100 MG PO CAPS: 100.0000 mg | ORAL_CAPSULE | Freq: Every day | ORAL | Status: DC

## 2024-05-10 MED ORDER — PRENATAL MULTIVITAMIN CH: 1.0000 | ORAL_TABLET | Freq: Every day | ORAL | Status: DC

## 2024-05-10 NOTE — OB Triage Note (Signed)
 Kelly Ford 28 y.o. @[redacted]w[redacted]d  H5E7987 presents to Labor & Delivery triage via wheelchair steered by ED staff reporting contractions every 2-3 mins (8/10 pain) that started around 0100. She denies signs and symptoms consistent with rupture of membranes or active vaginal bleeding. She states positive fetal movement. External FM and TOCO applied to non-tender abdomen. Initial FHR 135. Vital signs obtained and within normal limits. Patient oriented to care environment including call bell and bed control use. Wilson, CNM notified of patient's arrival. Plan to do 1 hour labor eval. Pt can come off monitor after reactive NST. Yanni Quiroa L. Wilberth Damon, RN BSN 05/10/2024 5:29 AM]

## 2024-05-10 NOTE — OB Triage Note (Signed)
 Patient discharged by Tanda, CNM. Return precautions discussed, patient verbalized understanding and denies questions/concerns at this time. Patient ambulated off unit accompanied by significant other.
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
## 2024-05-11 ENCOUNTER — Encounter: Payer: Self-pay | Admitting: Obstetrics and Gynecology

## 2024-05-11 ENCOUNTER — Inpatient Hospital Stay
Admission: EM | Admit: 2024-05-11 | Discharge: 2024-05-13 | DRG: 807 | Disposition: A | Discharge status: Home / Self Care | Attending: Obstetrics and Gynecology | Admitting: Obstetrics and Gynecology

## 2024-05-11 DIAGNOSIS — Z79890 Hormone replacement therapy (postmenopausal): Secondary | ICD-10-CM | POA: Diagnosis not present

## 2024-05-11 DIAGNOSIS — Z3A38 38 weeks gestation of pregnancy: Secondary | ICD-10-CM | POA: Diagnosis not present

## 2024-05-11 DIAGNOSIS — O99284 Endocrine, nutritional and metabolic diseases complicating childbirth: Principal | ICD-10-CM | POA: Diagnosis present

## 2024-05-11 DIAGNOSIS — E039 Hypothyroidism, unspecified: Secondary | ICD-10-CM | POA: Diagnosis present

## 2024-05-11 DIAGNOSIS — O26893 Other specified pregnancy related conditions, third trimester: Secondary | ICD-10-CM | POA: Diagnosis present

## 2024-05-11 LAB — CBC
HCT: 39.3 % (ref 36.0–46.0)
Hemoglobin: 13.4 g/dL (ref 12.0–15.0)
MCH: 30.6 pg (ref 26.0–34.0)
MCHC: 34.1 g/dL (ref 30.0–36.0)
MCV: 89.7 fL (ref 80.0–100.0)
Platelets: 228 K/uL (ref 150–400)
RBC: 4.38 MIL/uL (ref 3.87–5.11)
RDW: 12.8 % (ref 11.5–15.5)
WBC: 8.5 K/uL (ref 4.0–10.5)
nRBC: 0 % (ref 0.0–0.2)

## 2024-05-11 MED ORDER — LIDOCAINE HCL (PF) 1 % IJ SOLN
INTRAMUSCULAR | Status: AC
  Filled 2024-05-11: qty 30

## 2024-05-11 MED ORDER — LACTATED RINGERS IV SOLN: INTRAVENOUS | Status: DC

## 2024-05-11 MED ORDER — OXYTOCIN-SODIUM CHLORIDE 30-0.9 UT/500ML-% IV SOLN
2.5000 [IU]/h | INTRAVENOUS | Status: DC
  Administered 2024-05-12: 2.5 [IU]/h via INTRAVENOUS
  Filled 2024-05-11: qty 500

## 2024-05-11 MED ORDER — LIDOCAINE HCL (PF) 1 % IJ SOLN: 30.0000 mL | INTRAMUSCULAR | Status: DC | PRN

## 2024-05-11 MED ORDER — LACTATED RINGERS IV SOLN
500.0000 mL | INTRAVENOUS | Status: DC | PRN
  Administered 2024-05-11: 1000 mL via INTRAVENOUS

## 2024-05-11 MED ORDER — ONDANSETRON HCL 4 MG/2ML IJ SOLN: 4.0000 mg | Freq: Four times a day (QID) | INTRAMUSCULAR | Status: DC | PRN

## 2024-05-11 MED ORDER — AMMONIA AROMATIC IN INHA
RESPIRATORY_TRACT | Status: AC
  Filled 2024-05-11: qty 10

## 2024-05-11 MED ORDER — OXYCODONE-ACETAMINOPHEN 5-325 MG PO TABS: 2.0000 | ORAL_TABLET | ORAL | Status: DC | PRN

## 2024-05-11 MED ORDER — MISOPROSTOL 200 MCG PO TABS
ORAL_TABLET | ORAL | Status: AC
  Filled 2024-05-11: qty 4

## 2024-05-11 MED ORDER — OXYCODONE-ACETAMINOPHEN 5-325 MG PO TABS: 1.0000 | ORAL_TABLET | ORAL | Status: DC | PRN

## 2024-05-11 MED ORDER — OXYTOCIN BOLUS FROM INFUSION
333.0000 mL | Freq: Once | INTRAVENOUS | Status: AC
  Administered 2024-05-11: 333 mL via INTRAVENOUS

## 2024-05-11 MED ORDER — SOD CITRATE-CITRIC ACID 500-334 MG/5ML PO SOLN: 30.0000 mL | ORAL | Status: DC | PRN

## 2024-05-11 MED ORDER — ACETAMINOPHEN 325 MG PO TABS: 650.0000 mg | ORAL_TABLET | ORAL | Status: DC | PRN

## 2024-05-11 MED ORDER — FENTANYL CITRATE (PF) 100 MCG/2ML IJ SOLN: 50.0000 ug | INTRAMUSCULAR | Status: DC | PRN

## 2024-05-11 MED ORDER — OXYTOCIN 10 UNIT/ML IJ SOLN
INTRAMUSCULAR | Status: AC
  Filled 2024-05-11: qty 2
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
## 2024-05-11 NOTE — Progress Notes (Signed)
 Labor Progress Note  Kelly Ford is a 28 y.o. [phone] at [redacted]w[redacted]d by LMP admitted for active labor  Subjective: Pt is coping well with each contraction   Objective: BP 106/66   Pulse 80   Temp 97.9 F (36.6 C) (Oral)   Resp 16   Ht 5' 1 (1.549 m)   Wt 57.6 kg   BMI 24.00 kg/m   Fetal Assessment: FHT:  FHR: 145 bpm, variability: moderate,  accelerations:  Present,  decelerations:  Absent Category/reactivity:  Category I UC:   regular, every 2 minutes SVE:    Dilation: 7cm  Effacement: 90%  Station:  -2  Consistency: soft  Position: anterior  Membrane status:AROM at 2247 Amniotic color: mild MSAF  Labs: Lab Results  Component Value Date   WBC 8.9 01/14/2024   HGB 12.2 01/14/2024   HCT 37.4 01/14/2024   MCV 90.1 01/14/2024   PLT 245 01/14/2024    Assessment / Plan: Spontaneous labor, progressing normally  Labor: Progressing normally Preeclampsia:  106/66 Fetal Wellbeing:  Category I Pain Control:  Labor support without medications I/D:  Afebrile, GBS neg, AROM Anticipated MOD:  NSVD  Kelly Ford, CNM 05/11/2024, 10:49 PM

## 2024-05-12 ENCOUNTER — Other Ambulatory Visit: Payer: Self-pay

## 2024-05-12 LAB — CBC
HCT: 37 % (ref 36.0–46.0)
Hemoglobin: 12.5 g/dL (ref 12.0–15.0)
MCH: 30.7 pg (ref 26.0–34.0)
MCHC: 33.8 g/dL (ref 30.0–36.0)
MCV: 90.9 fL (ref 80.0–100.0)
Platelets: 223 K/uL (ref 150–400)
RBC: 4.07 MIL/uL (ref 3.87–5.11)
RDW: 12.8 % (ref 11.5–15.5)
WBC: 11.1 K/uL — ABNORMAL HIGH (ref 4.0–10.5)
nRBC: 0 % (ref 0.0–0.2)

## 2024-05-12 LAB — TYPE AND SCREEN

## 2024-05-12 LAB — SYPHILIS: RPR W/REFLEX TO RPR TITER AND TREPONEMAL ANTIBODIES, TRADITIONAL SCREENING AND DIAGNOSIS ALGORITHM: RPR Ser Ql: NONREACTIVE

## 2024-05-12 MED ORDER — ONDANSETRON HCL 4 MG PO TABS
4.0000 mg | ORAL_TABLET | ORAL | Status: DC | PRN
  Administered 2024-05-12: 4 mg via ORAL
  Filled 2024-05-12 (×2): qty 1

## 2024-05-12 MED ORDER — LEVOTHYROXINE SODIUM 50 MCG PO TABS
50.0000 ug | ORAL_TABLET | Freq: Every day | ORAL | Status: DC
  Administered 2024-05-12 – 2024-05-13 (×2): 50 ug via ORAL
  Filled 2024-05-12 (×2): qty 1

## 2024-05-12 MED ORDER — DIBUCAINE (PERIANAL) 1 % EX OINT
1.0000 | TOPICAL_OINTMENT | CUTANEOUS | Status: DC | PRN
  Administered 2024-05-12: 1 via RECTAL
  Filled 2024-05-12: qty 28

## 2024-05-12 MED ORDER — ONDANSETRON HCL 4 MG/2ML IJ SOLN
INTRAMUSCULAR | Status: AC
  Administered 2024-05-12: 4 mg via INTRAVENOUS
  Filled 2024-05-12: qty 2

## 2024-05-12 MED ORDER — PRENATAL MULTIVITAMIN CH
1.0000 | ORAL_TABLET | Freq: Every day | ORAL | Status: DC
  Administered 2024-05-12 – 2024-05-13 (×2): 1 via ORAL
  Filled 2024-05-12 (×2): qty 1

## 2024-05-12 MED ORDER — SENNOSIDES-DOCUSATE SODIUM 8.6-50 MG PO TABS
2.0000 | ORAL_TABLET | Freq: Every day | ORAL | Status: DC
  Administered 2024-05-13: 2 via ORAL
  Filled 2024-05-12: qty 2

## 2024-05-12 MED ORDER — BENZOCAINE-MENTHOL 20-0.5 % EX AERO
1.0000 | INHALATION_SPRAY | CUTANEOUS | Status: DC | PRN
  Administered 2024-05-12: 1 via TOPICAL
  Filled 2024-05-12 (×3): qty 56

## 2024-05-12 MED ORDER — WITCH HAZEL-GLYCERIN EX PADS
1.0000 | MEDICATED_PAD | CUTANEOUS | Status: DC | PRN
  Administered 2024-05-12: 1 via TOPICAL
  Filled 2024-05-12 (×3): qty 100

## 2024-05-12 MED ORDER — TETANUS-DIPHTH-ACELL PERTUSSIS 5-2-15.5 LF-MCG/0.5 IM SUSP: 0.5000 mL | Freq: Once | INTRAMUSCULAR | Status: DC

## 2024-05-12 MED ORDER — COCONUT OIL OIL: 1.0000 | TOPICAL_OIL | Status: DC | PRN

## 2024-05-12 MED ORDER — OXYCODONE HCL 5 MG PO TABS: 5.0000 mg | ORAL_TABLET | ORAL | Status: DC | PRN

## 2024-05-12 MED ORDER — ONDANSETRON HCL 4 MG/2ML IJ SOLN: 4.0000 mg | INTRAMUSCULAR | Status: DC | PRN

## 2024-05-12 MED ORDER — FERROUS SULFATE 325 (65 FE) MG PO TABS
325.0000 mg | ORAL_TABLET | Freq: Two times a day (BID) | ORAL | Status: DC
  Administered 2024-05-12 – 2024-05-13 (×3): 325 mg via ORAL
  Filled 2024-05-12 (×3): qty 1

## 2024-05-12 MED ORDER — IBUPROFEN 600 MG PO TABS
600.0000 mg | ORAL_TABLET | Freq: Four times a day (QID) | ORAL | Status: DC
  Administered 2024-05-12 – 2024-05-13 (×7): 600 mg via ORAL
  Filled 2024-05-12 (×7): qty 1

## 2024-05-12 MED ORDER — ACETAMINOPHEN 325 MG PO TABS
650.0000 mg | ORAL_TABLET | ORAL | Status: DC | PRN
  Administered 2024-05-12 – 2024-05-13 (×3): 650 mg via ORAL
  Filled 2024-05-12 (×3): qty 2

## 2024-05-12 MED ORDER — OXYCODONE HCL 5 MG PO TABS: 10.0000 mg | ORAL_TABLET | ORAL | Status: DC | PRN

## 2024-05-12 MED ORDER — SIMETHICONE 80 MG PO CHEW: 80.0000 mg | CHEWABLE_TABLET | ORAL | Status: DC | PRN

## 2024-05-12 MED ORDER — DIPHENHYDRAMINE HCL 25 MG PO CAPS: 25.0000 mg | ORAL_CAPSULE | Freq: Four times a day (QID) | ORAL | Status: DC | PRN

## 2024-05-12 NOTE — Lactation Note (Signed)
 This note was copied from a baby's chart. Lactation Consultation Note  Patient Name: Kelly Ford Date: 05/12/2024 Age:27 hours   RN offered lactation services but mom states she does not need assistance at this time.  Maternal Data    Feeding    LATCH Score                    Lactation Tools Discussed/Used    Interventions    Discharge    Consult Status      [name] 05/12/2024, 7:59 PM

## 2024-05-12 NOTE — Progress Notes (Signed)
 Post Partum Day 1  Subjective: Doing well, no concerns. Ambulating without difficulty, pain managed with PO meds, tolerating regular diet, and voiding without difficulty.   No fever/chills, chest pain, shortness of breath, nausea/vomiting, or leg pain. No nipple or breast pain. No headache, visual changes, or RUQ/epigastric pain.  Objective: BP 100/65 (BP Location: Left Arm)   Pulse 60   Temp 97.8 F (36.6 C) (Oral)   Resp 19   Ht 5' 1 (1.549 m)   Wt 57.6 kg   SpO2 97%   BMI 24.00 kg/m    Physical Exam:  General: alert and cooperative Breasts: soft/nontender CV: RRR Pulm: nl effort Abdomen: soft, non-tender Uterine Fundus: firm Incision: n/a Perineum: minimal edema, intact Lochia: appropriate DVT Evaluation: No evidence of DVT seen on physical exam. Edinburgh:     05/12/2024    3:00 AM 03/14/2022   11:30 AM 05/26/2019    3:30 PM  Edinburgh Postnatal Depression Scale Screening Tool  I have been able to laugh and see the funny side of things. -- 0  0   I have looked forward with enjoyment to things.  0  0   I have blamed myself unnecessarily when things went wrong.  2  1   I have been anxious or worried for no good reason.  2  1   I have felt scared or panicky for no good reason.  1  0   Things have been getting on top of me.  1  0   I have been so unhappy that I have had difficulty sleeping.  0  0   I have felt sad or miserable.  0  0   I have been so unhappy that I have been crying.  1  0   The thought of harming myself has occurred to me.  0  0   Edinburgh Postnatal Depression Scale Total  7  2      Data saved with a previous flowsheet row definition     Recent Labs    05/11/24 2239  HGB 13.4  HCT 39.3  WBC 8.5  PLT 228    Assessment/Plan: 28 y.o. H5E7987 postpartum day # 1  1. Continue routine postpartum care  2. Infant feeding status: breast feeding -Lactation consult PRN for breastfeeding   3. Contraception plan: undecided  4. Acute blood  loss anemia - clinically not significant .  -Hemodynamically stable and asymptomatic  5. Immunization status:   all immunizations up to date  Disposition: Continue inpatient postpartum care    LOS: 1 day   Demetrio Leighty, CNM 05/12/2024, 8:09 AM

## 2024-05-12 NOTE — Discharge Summary (Signed)
 Postpartum Discharge Summary  Patient Name: Kelly Ford DOB: May 01, 1996 MRN: [id number]  Date of admission: 05/11/2024 Delivery date:05/11/2024 Delivering provider: MYRON NEST Date of discharge: 05/13/2024  Primary OB: [facility] LMP:No LMP recorded. Patient is pregnant. EDC Estimated Date of Delivery: 05/20/24 Gestational Age at Delivery: [redacted]w[redacted]d   Admitting diagnosis: Normal labor [O80, Z37.9] Intrauterine pregnancy: [redacted]w[redacted]d     Secondary diagnosis:   Principal Problem:   Normal labor   Discharge Diagnosis: Term Pregnancy Delivered and Anemia      Hospital course: Onset of Labor With Vaginal Delivery      28 y.o. yo [id number] at [redacted]w[redacted]d was admitted in Active Labor on 05/11/2024. Labor course was complicated by none.  Membrane Rupture Time/Date: 10:47 PM,05/11/2024  Delivery Method:Vaginal, Spontaneous Operative Delivery:N/A Episiotomy: None Lacerations:  None Patient had a postpartum course complicated by nothing.  She is ambulating, tolerating a regular diet, passing flatus, and urinating well. Patient is discharged home in stable condition on 05/13/2024.  Newborn Data: Birth date:05/11/2024 Birth time:11:47 PM Gender:Female Living status:Living Apgars:8 ,9  Weight:2610 g                                            Post partum procedures:none Augmentation:: AROM Complications: None Delivery Type: spontaneous vaginal delivery Anesthesia: non-pharmacological methods Placenta: spontaneous To Pathology: No   Prenatal Labs:  Blood type/Rh A pos  Antibody screen neg  Rubella Immune  Varicella Immune  RPR NR  HBsAg Neg  HIV NR  GC neg  Chlamydia neg  Genetic screening negative  1 hour GTT 65  3 hour GTT    GBS neg    Magnesium  Sulfate received: No BMZ received: No Rhophylac:was not indicated MMR: was not indicated Varivax vaccine given: was not indicated T-DaP:Given prenatally Flu: Given prenatally   Physical exam  Vitals:    05/12/24 1132 05/12/24 1534 05/12/24 2313 05/13/24 0740  BP: 99/66 103/66 111/68 105/70  Pulse: 75 60 70 76  Resp: 20 18 20 18   Temp: 97.9 F (36.6 C) 98 F (36.7 C) 98.7 F (37.1 C) 98 F (36.7 C)  TempSrc:  Oral  Oral  SpO2:   98% 99%  Weight:      Height:       General: alert, cooperative, and no distress Lochia: appropriate Uterine Fundus: firm Perineum:minimal edema/tiny lac hemostatic  DVT Evaluation: No evidence of DVT seen on physical exam.  Labs: Lab Results  Component Value Date   WBC 11.1 (H) 05/12/2024   HGB 12.5 05/12/2024   HCT 37.0 05/12/2024   MCV 90.9 05/12/2024   PLT 223 05/12/2024      Latest Ref Rng & Units 01/14/2024    8:21 AM  CMP  Glucose 70 - 99 mg/dL 99   BUN 6 - 20 mg/dL 14   Creatinine 9.55 - 1.00 mg/dL 9.53   Sodium 864 - 854 mmol/L 135   Potassium 3.5 - 5.1 mmol/L 3.3   Chloride 98 - 111 mmol/L 103   CO2 22 - 32 mmol/L 26   Calcium  8.9 - 10.3 mg/dL 8.9   Total Protein 6.5 - 8.1 g/dL 6.9   Total Bilirubin 0.0 - 1.2 mg/dL 0.4   Alkaline Phos 38 - 126 U/L 39   AST 15 - 41 U/L 13   ALT 0 - 44 U/L 10    Edinburgh Score:    05/13/2024  7:40 AM  Edinburgh Postnatal Depression Scale Screening Tool  I have been able to laugh and see the funny side of things. 0  I have looked forward with enjoyment to things. 0  I have blamed myself unnecessarily when things went wrong. 1  I have been anxious or worried for no good reason. 1  I have felt scared or panicky for no good reason. 0  Things have been getting on top of me. 1  I have been so unhappy that I have had difficulty sleeping. 0  I have felt sad or miserable. 1  I have been so unhappy that I have been crying. 0  The thought of harming myself has occurred to me. 0  Edinburgh Postnatal Depression Scale Total 4    Risk assessment for postpartum VTE and prophylactic treatment: Very high risk factors: None High risk factors: None Moderate risk factors: None  Postpartum VTE  prophylaxis with LMWH not indicated  After visit meds:  Allergies as of 05/13/2024   No Known Allergies      Medication List     TAKE these medications    acetaminophen  500 MG tablet Commonly known as: TYLENOL  Take 2 tablets (1,000 mg total) by mouth every 6 (six) hours.   doxylamine (Sleep) 25 MG tablet Commonly known as: UNISOM Take 25 mg by mouth at bedtime as needed.   ferrous sulfate  325 (65 FE) MG tablet Take 325 mg by mouth daily with breakfast.   levothyroxine  75 MCG tablet Commonly known as: SYNTHROID  Take 50 mcg by mouth daily before breakfast.   loperamide 2 MG capsule Commonly known as: IMODIUM Take 2 mg by mouth as needed for diarrhea or loose stools.   ondansetron  4 MG/5ML solution Commonly known as: ZOFRAN  Take 8 mg by mouth once.   prenatal multivitamin Tabs tablet Take 1 tablet by mouth daily at 12 noon.   Vitamin D (Ergocalciferol) 1.25 MG (50000 UNIT) Caps capsule Commonly known as: DRISDOL Take 50,000 Units by mouth every 7 (seven) days.       Discharge home in stable condition Infant Feeding: Breast Infant Disposition:home with mother Discharge instruction: per After Visit Summary and Postpartum booklet. Activity: Advance as tolerated. Pelvic rest for 6 weeks.  Diet: routine diet  Postpartum Appointment:6 weeks Additional Postpartum F/U: Postpartum Depression checkup Future Appointments:No future appointments. Follow up Visit:  Follow-up Information     Myron Nest, CNM. Schedule an appointment as soon as possible for a visit in 1 week(s).   Specialty: Certified Nurse Midwife Why: for a mood check Contact information: [address] [place] [address] [phone]                 Plan:  Corene Resnick was discharged to home in good condition. Follow-up appointment as directed.    Signed: Heather Penton 05/13/2024

## 2024-05-12 NOTE — Lactation Note (Signed)
 This note was copied from a baby's chart. Lactation Consultation Note  Patient Name: Kelly Ford Unijb'd Date: 05/12/2024 Age:27 hours Reason for consult: Initial assessment;Early term 37-38.6wks   Maternal Data Does the patient have breastfeeding experience prior to this delivery?: Yes How long did the patient breastfeed?: 1st - 75yr 3 months, 2nd -10months  Initial assessment w/ a 13hr old baby Kelly and mom.  This is moms 3rd baby.  She is an experienced breastfeeding parent and did not have any major feeding issues with other children.  A SVD.  Patient w/ a hx of vitamin D deficiency, PPD, diagnosed w/ hypothyroidism on 11/25.   Mom stated that she feels like infant just doesn't want to eat because she is stuffy.    Feeding Mother's Current Feeding Choice: Breast Milk  Infant displayed feeding cues while visiting mom.  LC independently latched infant.  LC provided some minor tips to help her sustain latch.  Bringing infant closer to her body and providing support pillows.  Audible swallows were heard and seen.   LATCH Score Latch: Grasps breast easily, tongue down, lips flanged, rhythmical sucking.  Audible Swallowing: Spontaneous and intermittent  Type of Nipple: Everted at rest and after stimulation  Comfort (Breast/Nipple): Soft / non-tender  Hold (Positioning): No assistance needed to correctly position infant at breast.  LATCH Score: 10  Interventions Interventions: Breast feeding basics reviewed;Assisted with latch;Breast compression;Adjust position;Support pillows;Education  LC provided education on the following;  milk production expectations, hunger cues, day 1/2 wet/dirty diapers, hand expression, cluster feeding, benefits of STS and arousing infant for a feeding.  Lactation informed patient of feeding infant at least 8 or more times w/in a 24hr period but not exceeding 3hrs. Patient verbalized understanding.   Discharge Discharge Education: Outpatient  recommendation Pump: Personal;Hands Elner Seifert;Manual;DEBP WIC Program: Yes  Consult Status Consult Status: Follow-up Follow-up type: In-patient    Sydni Elizarraraz S Arfa Lamarca 05/12/2024, 3:25 PM

## 2024-05-13 NOTE — Lactation Note (Signed)
 This note was copied from a baby's chart. Lactation Consultation Note  Patient Name: Kelly Ford Unijb'd Date: 05/13/2024 Age:27 hours Reason for consult: Maternal discharge   Maternal Data Does the patient have breastfeeding experience prior to this delivery?: Yes  Visited w/ family of [redacted]w[redacted]d old, female baby [name]. [name] is P3 and stated breastfeeding is going ok. She states baby is having a little issue with latching due to fluid in the baby's nose. Mom states baby's output is great despite some stretches in feed. She states she is having to wake baby to feed sometimes. She states she breastfed her other two children and produced so much milk she was making a gallon per day and donating to 3 families. Mom states she is familiar with clusterfeeding and she experienced mastitis with her first child and engorgement with her previous 2.  Feeding Mother's Current Feeding Choice: Breast Milk  Interventions  Refreshed on engorgement updated protocol.Provided bm storage guidelines. F/U w/ LC if any questions or concerns arise.  Discharge Discharge Education: Engorgement and breast care Pump: Personal  Consult Status Consult Status: Complete Follow-up type: Out-patient    Zada Ned 05/13/2024, 2:24 PM

## 2024-05-13 NOTE — Discharge Instructions (Signed)
Discharge instructions Bleeding: Your bleeding could continue up to 6 weeks, the flow should gradually decrease and the color should become dark then lightened over the next couple of weeks. If you notice you are bleeding heavily or passing clots larger than the size of your fist, PLEASE call your physician. No TAMPONS, DOUCHING, ENEMAS OR SEXUAL INTERCOURSE for 6 weeks. Stitches: Shower daily with mild soap and water. Stitches will dissolve over the next couple of weeks, if you experience any discomfort in the vaginal area you may sit in warm water 15-20 minutes, 3-4 times per day. Just enough water to cover vaginal area. AfterPains: This is the uterus contracting back to its normal position and size. Use medications prescribed or recommended by your physician to help relieve this discomfort. Bowels/Hemorrhoids: Drink plenty of water and stay active. Increase fiber, fresh fruits and vegetables in your diet. Rest/Activity: Rest when the baby is resting;  Do not lift > 10 lbs for 6 weeks. No driving for 1-2 weeks. Bathing: Shower daily! Diet: Continue daily prenatal vitamin and iron until your follow up visit to help replenish nutrients and vitamins. If breastfeeding eat extra calories and increase your fluid intake to 12 glasses a day. Contraception: Consult with your provider on what method of birth control you would like to use. Breastfeeding: You may have a slight fever when your milk comes in, but it should go away on its own. If it does not, and rises above 101.0 please call the doctor. Bottlefeeding: wear a snug fitting bra without underwires continuously for 3-5days, avoid any nipple/breast stimulation. If engorgement occurs, take ibuprofen as prescribed and apply fresh green cabbage leaves directly to your breasts inside the bra cups. Postpartum "BLUES": It is common to emotional days after delivery, however if it persist for greater than 2 weeks or if you feel concerned please let your physician  know immediately. This is hormone driven and nothing you can control so please let someone know how you feel. Follow Up Visit: Please schedule a follow up visit with your delivering provider  Call office if you have any of the following: headache, visual changes, fever >101.0 F, chills, breast concerns, excessive vaginal bleeding, incision drainage or problems, leg pain or redness, depression or any other concerns.  For concerns about your baby, please call your pediatrician For breastfeeding concerns, the lactation consultant can be reached at [phone]

## 2024-05-13 NOTE — Progress Notes (Signed)
Patient discharged. Discharge instructions given. Patient verbalizes understanding. Transported by RN.
